# Patient Record
Sex: Female | Born: 1965 | Race: White | Hispanic: Yes | Marital: Married | Smoking: Never smoker
Health system: Southern US, Community
[De-identification: ages and names within clinical notes are randomized; demographics above are authoritative.]

## PROBLEM LIST (undated history)

## (undated) DIAGNOSIS — F32A Depression, unspecified: Secondary | ICD-10-CM

## (undated) DIAGNOSIS — F329 Major depressive disorder, single episode, unspecified: Secondary | ICD-10-CM

## (undated) DIAGNOSIS — K2970 Gastritis, unspecified, without bleeding: Secondary | ICD-10-CM

## (undated) DIAGNOSIS — F319 Bipolar disorder, unspecified: Secondary | ICD-10-CM

## (undated) DIAGNOSIS — F419 Anxiety disorder, unspecified: Secondary | ICD-10-CM

## (undated) DIAGNOSIS — M797 Fibromyalgia: Secondary | ICD-10-CM

## (undated) DIAGNOSIS — K589 Irritable bowel syndrome without diarrhea: Secondary | ICD-10-CM

## (undated) DIAGNOSIS — G43909 Migraine, unspecified, not intractable, without status migrainosus: Secondary | ICD-10-CM

## (undated) HISTORY — PX: FINGER SURGERY: SHX640

## (undated) HISTORY — PX: DILATION AND CURETTAGE OF UTERUS: SHX78

---

## 2006-07-12 ENCOUNTER — Encounter: Payer: Self-pay | Admitting: Gastroenterology

## 2006-07-18 ENCOUNTER — Encounter: Payer: Self-pay | Admitting: Gastroenterology

## 2008-03-26 ENCOUNTER — Ambulatory Visit: Payer: Self-pay | Admitting: Gastroenterology

## 2008-03-26 DIAGNOSIS — R1013 Epigastric pain: Secondary | ICD-10-CM

## 2008-03-26 DIAGNOSIS — K219 Gastro-esophageal reflux disease without esophagitis: Secondary | ICD-10-CM

## 2008-03-30 ENCOUNTER — Telehealth: Payer: Self-pay | Admitting: Gastroenterology

## 2008-03-30 ENCOUNTER — Encounter: Payer: Self-pay | Admitting: Gastroenterology

## 2008-04-23 ENCOUNTER — Ambulatory Visit: Payer: Self-pay | Admitting: Gastroenterology

## 2008-04-23 DIAGNOSIS — A088 Other specified intestinal infections: Secondary | ICD-10-CM

## 2008-04-24 ENCOUNTER — Encounter (INDEPENDENT_AMBULATORY_CARE_PROVIDER_SITE_OTHER): Payer: Self-pay

## 2008-04-24 LAB — CONVERTED CEMR LAB
AST: 29 units/L (ref 0–37)
Albumin: 3.6 g/dL (ref 3.5–5.2)
Alkaline Phosphatase: 45 units/L (ref 39–117)
BUN: 7 mg/dL (ref 6–23)
Basophils Relative: 0.2 % (ref 0.0–3.0)
Creatinine, Ser: 0.7 mg/dL (ref 0.4–1.2)
Eosinophils Absolute: 0.1 10*3/uL (ref 0.0–0.7)
Eosinophils Relative: 2.3 % (ref 0.0–5.0)
GFR calc non Af Amer: 97 mL/min
Glucose, Bld: 90 mg/dL (ref 70–99)
H Pylori IgG: NEGATIVE
HCT: 42.7 % (ref 36.0–46.0)
Hemoglobin: 15.1 g/dL — ABNORMAL HIGH (ref 12.0–15.0)
Lipase: 23 units/L (ref 11.0–59.0)
MCV: 91.3 fL (ref 78.0–100.0)
Monocytes Absolute: 0.5 10*3/uL (ref 0.1–1.0)
Monocytes Relative: 12.7 % — ABNORMAL HIGH (ref 3.0–12.0)
Neutro Abs: 1.8 10*3/uL (ref 1.4–7.7)
Platelets: 159 10*3/uL (ref 150–400)
Potassium: 3.6 meq/L (ref 3.5–5.1)
RBC: 4.68 M/uL (ref 3.87–5.11)
Total Bilirubin: 0.5 mg/dL (ref 0.3–1.2)
WBC: 3.8 10*3/uL — ABNORMAL LOW (ref 4.5–10.5)

## 2008-04-27 ENCOUNTER — Ambulatory Visit (HOSPITAL_COMMUNITY): Admission: RE | Admit: 2008-04-27 | Discharge: 2008-04-27 | Payer: Self-pay | Admitting: Gastroenterology

## 2008-04-27 DIAGNOSIS — R933 Abnormal findings on diagnostic imaging of other parts of digestive tract: Secondary | ICD-10-CM

## 2008-04-30 ENCOUNTER — Encounter: Admission: RE | Admit: 2008-04-30 | Discharge: 2008-04-30 | Payer: Self-pay | Admitting: Gastroenterology

## 2008-05-01 ENCOUNTER — Encounter: Payer: Self-pay | Admitting: Gastroenterology

## 2008-05-18 ENCOUNTER — Encounter: Payer: Self-pay | Admitting: Gastroenterology

## 2008-05-27 ENCOUNTER — Ambulatory Visit: Payer: Self-pay | Admitting: Gastroenterology

## 2008-06-12 ENCOUNTER — Ambulatory Visit: Payer: Self-pay | Admitting: Gastroenterology

## 2008-06-15 ENCOUNTER — Encounter: Payer: Self-pay | Admitting: Gastroenterology

## 2008-06-15 LAB — CONVERTED CEMR LAB: UREASE: NEGATIVE

## 2008-06-19 ENCOUNTER — Telehealth: Payer: Self-pay | Admitting: Gastroenterology

## 2008-06-22 ENCOUNTER — Telehealth: Payer: Self-pay | Admitting: Gastroenterology

## 2008-07-15 ENCOUNTER — Ambulatory Visit: Payer: Self-pay | Admitting: Gastroenterology

## 2008-08-06 ENCOUNTER — Emergency Department (HOSPITAL_COMMUNITY): Admission: EM | Admit: 2008-08-06 | Discharge: 2008-08-06 | Payer: Self-pay | Admitting: Emergency Medicine

## 2010-03-28 ENCOUNTER — Encounter: Payer: Self-pay | Admitting: Emergency Medicine

## 2010-06-13 LAB — POCT I-STAT, CHEM 8
BUN: 12 mg/dL (ref 6–23)
Chloride: 102 mEq/L (ref 96–112)
Sodium: 138 mEq/L (ref 135–145)

## 2010-06-13 LAB — DIFFERENTIAL
Basophils Absolute: 0 10*3/uL (ref 0.0–0.1)
Eosinophils Absolute: 0 10*3/uL (ref 0.0–0.7)
Eosinophils Relative: 1 % (ref 0–5)
Lymphocytes Relative: 35 % (ref 12–46)
Neutrophils Relative %: 56 % (ref 43–77)

## 2010-06-13 LAB — URINALYSIS, ROUTINE W REFLEX MICROSCOPIC
Ketones, ur: NEGATIVE mg/dL
Nitrite: NEGATIVE
Protein, ur: NEGATIVE mg/dL
pH: 7.5 (ref 5.0–8.0)

## 2010-06-13 LAB — CBC
HCT: 41.1 % (ref 36.0–46.0)
Platelets: 183 10*3/uL (ref 150–400)
RDW: 12.8 % (ref 11.5–15.5)
WBC: 5.9 10*3/uL (ref 4.0–10.5)

## 2010-06-13 LAB — POCT PREGNANCY, URINE: Preg Test, Ur: NEGATIVE

## 2010-06-13 LAB — GC/CHLAMYDIA PROBE AMP, GENITAL: Chlamydia, DNA Probe: NEGATIVE

## 2011-11-16 ENCOUNTER — Ambulatory Visit: Payer: Self-pay | Admitting: Pain Medicine

## 2014-06-18 ENCOUNTER — Other Ambulatory Visit: Payer: Self-pay | Admitting: Internal Medicine

## 2014-06-18 DIAGNOSIS — I83813 Varicose veins of bilateral lower extremities with pain: Secondary | ICD-10-CM

## 2014-07-02 ENCOUNTER — Ambulatory Visit
Admission: RE | Admit: 2014-07-02 | Discharge: 2014-07-02 | Disposition: A | Discharge status: Home / Self Care | Payer: BLUE CROSS/BLUE SHIELD | Source: Ambulatory Visit | Attending: Internal Medicine | Admitting: Internal Medicine

## 2014-07-02 DIAGNOSIS — I83813 Varicose veins of bilateral lower extremities with pain: Secondary | ICD-10-CM

## 2014-07-02 HISTORY — DX: Depression, unspecified: F32.A

## 2014-07-02 HISTORY — DX: Irritable bowel syndrome, unspecified: K58.9

## 2014-07-02 HISTORY — DX: Fibromyalgia: M79.7

## 2014-07-02 HISTORY — DX: Anxiety disorder, unspecified: F41.9

## 2014-07-02 HISTORY — DX: Bipolar disorder, unspecified: F31.9

## 2014-07-02 HISTORY — DX: Major depressive disorder, single episode, unspecified: F32.9

## 2014-07-02 HISTORY — DX: Gastritis, unspecified, without bleeding: K29.70

## 2014-07-02 HISTORY — DX: Migraine, unspecified, not intractable, without status migrainosus: G43.909

## 2014-07-02 NOTE — Consult Note (Signed)
Chief Complaint: Chief Complaint  Patient presents with  . Advice Only    [facility]icose Veins    Referring Physician(s): Nicholos Johnsamachandran, Ajith  History of Present Illness: Valerie Kent is a 49 y.o. female presenting today to the clinic for evaluation of potential chronic venous insufficiency contributing to lower leg symptoms of restless legs and discomfort.    Valerie Kent states that she has been experiencing symptoms of restless legs for several years, which is most noticeable at night/bedtime, sometimes causing her to lose sleep.  She denies any symptoms of significant swelling or pitting edema.  She denies any focal pain of her lower extremities.  She says she has occasional feeling of heaviness, but this is not temporally related to the time of day, or to being on her feet for long periods.  She does not seem to be overly concerned about the telangiectasias/spider veins that she has, but does notice that the right side is affected to a greater degree.   She has not been wearing compression stockings at this point, and has not been taking any specific medications for her leg symptoms.   She has no knowledge of a prior DVT/blood clot, and has not ever had a prior venous or arterial surgery.   Past Medical History  Diagnosis Date  . Migraines   . IBS (irritable bowel syndrome)   . Bipolar disorder   . Gastritis   . Depression   . Anxiety   . Fibromyalgia     Past Surgical History  Procedure Laterality Date  . No past surgeries      Allergies: Review of patient's allergies indicates not on file.  Medications: Prior to Admission medications   Medication Sig Start Date End Date Taking? Authorizing Provider  calcium citrate-vitamin D (CITRACAL+D) 315-200 MG-UNIT per tablet Take 1 tablet by mouth 2 (two) times daily.   Yes Historical Provider, MD  cyclobenzaprine (FLEXERIL) 10 MG tablet Take 10 mg by mouth 2 (two) times daily as needed for muscle spasms.   Yes  Historical Provider, MD  DULoxetine (CYMBALTA) 60 MG capsule Take 60 mg by mouth daily.   Yes Historical Provider, MD  lamoTRIgine (LAMICTAL) 100 MG tablet Take 100 mg by mouth daily.   Yes Historical Provider, MD  MULTIPLE VITAMIN PO Take 1 tablet by mouth daily.   Yes Historical Provider, MD  Omega-3 Fatty Acids (FISH OIL) 1000 MG CAPS Take 1 capsule by mouth daily.   Yes Historical Provider, MD  verapamil (VERELAN PM) 360 MG 24 hr capsule Take 360 mg by mouth daily after breakfast.   Yes Historical Provider, MD  zolpidem (AMBIEN) 10 MG tablet Take 10 mg by mouth at bedtime as needed for sleep.   Yes Historical Provider, MD     No family history on file.  History   Social History  . Marital Status: Married    Spouse Name: N/A  . Number of Children: N/A  . Years of Education: N/A   Social History Main Topics  . Smoking status: Never Smoker   . Smokeless tobacco: Never Used  . Alcohol Use: 0.0 oz/week    0 Standard drinks or equivalent per week     Comment: occasional glass of wine   . Drug Use: No  . Sexual Activity: Not on file   Other Topics Concern  . None   Social History Narrative  . None     Review of Systems: A 12 point ROS discussed and pertinent  positives are indicated in the HPI above.  All other systems are negative.  Review of Systems  Vital Signs: BP 124/76 mmHg  Pulse 82  Temp(Src) 98.4 F (36.9 C) (Oral)  Resp 14  Ht  (1.549 m)  Wt 118 lb (53.524 kg)  BMI 22.31 kg/m2  SpO2 99%  LMP 06/25/2014 (Approximate)  Physical Exam  Atraumatic, normocephalic.  Mucous membranes moist and pink.  No scleral icterus.  No scleral injection.  Conjugate gaze.  No adenopathy.  Neck soft supple.  CTA bilateral.  Neg W/R/R. RRR.  Neg third sounds. Abd s/nt/nd.  No rigidity. GU is deferred.  Lower extremity without pitting edema.  Telangiectasias of the bilateral lower extremity, present on the left and right calf (ant and post), worst on the right.   Telangiectasias of the right thigh. Minimal varicosity formation.  No lipodernatosclerosis or hemosiderin deposition.  No healed or open wounds.  Strong palpable DP/PT pulses.   Imaging:  DVT duplex with direct duplex of the bilateral superficial system completed today in the office.  Negative for DVT bilateral.  No evidence of GSV or SSV reflux in the left or right LE.  She has small open perforator veins of the bilateral calf regions, with no significant reflux.   Labs:  CBC: No results for input(s): WBC, HGB, HCT, PLT in the last 8760 hours.  COAGS: No results for input(s): INR, APTT in the last 8760 hours.  BMP: No results for input(s): NA, K, CL, CO2, GLUCOSE, BUN, CALCIUM, CREATININE, GFRNONAA, GFRAA in the last 8760 hours.  Invalid input(s): CMP  LIVER FUNCTION TESTS: No results for input(s): BILITOT, AST, ALT, ALKPHOS, PROT, ALBUMIN in the last 8760 hours.  TUMOR MARKERS: No results for input(s): AFPTM, CEA, CA199, CHROMGRNA in the last 8760 hours.  Assessment and Plan:  Valerie Jaquith is a 49 year old female with primary complaint of restless legs, a common symptom seen in the setting of chronic insufficiency.  She also has right greater than left lower extremity telangiectasias, with minimal varicosity formation.    A duplex US completed today demonstrates no evidence of venous reflux, so that I believe her symptoms are not related to any treatable venous disease.  She does have a small perforator vein of both the left and the right calf, which is favored not to be related to her current symptoms.    I had a long discussion with Valerie Kent about the pathophysiology of chronic venous insufficiency including the natural history and the potential treatment options, of which I believe endovascular ablation is not indicated at this time.  I do think that daily compression stocking usage could potentially help her, even if it were to slow the progression of any development of venous  insufficiency. I encouraged her to follow up with our office if she ever experiences progression of her symptoms, potentially for a repeat duplex exam.   She seems satisfied with our discussion and agrees with the plan of care.    Thank you for this interesting consult.  I greatly enjoyed meeting Valerie Kent and participating in their care.  SignedGilmer Mor 07/02/2014, 4:31 PM   I spent a total of  45 Minutes   in face to face in clinical consultation, greater than 50% of which was counseling/coordinating care for restless legs syndrome and superficial venous telangiectasias, possible chronic venous insufficiency.

## 2014-08-05 ENCOUNTER — Encounter: Payer: Self-pay | Admitting: Gastroenterology

## 2017-01-12 ENCOUNTER — Other Ambulatory Visit: Payer: Self-pay | Admitting: Orthopedic Surgery

## 2017-01-16 ENCOUNTER — Other Ambulatory Visit: Payer: Self-pay

## 2017-01-16 ENCOUNTER — Encounter (HOSPITAL_BASED_OUTPATIENT_CLINIC_OR_DEPARTMENT_OTHER): Payer: Self-pay | Admitting: *Deleted

## 2017-01-19 ENCOUNTER — Encounter (HOSPITAL_BASED_OUTPATIENT_CLINIC_OR_DEPARTMENT_OTHER)
Admission: RE | Disposition: A | Discharge status: Home / Self Care | Payer: Self-pay | Source: Ambulatory Visit | Attending: Orthopedic Surgery

## 2017-01-19 ENCOUNTER — Ambulatory Visit (HOSPITAL_BASED_OUTPATIENT_CLINIC_OR_DEPARTMENT_OTHER): Payer: BLUE CROSS/BLUE SHIELD | Admitting: Anesthesiology

## 2017-01-19 ENCOUNTER — Other Ambulatory Visit: Payer: Self-pay

## 2017-01-19 ENCOUNTER — Ambulatory Visit (HOSPITAL_BASED_OUTPATIENT_CLINIC_OR_DEPARTMENT_OTHER)
Admission: RE | Admit: 2017-01-19 | Discharge: 2017-01-19 | Disposition: A | Discharge status: Home / Self Care | Payer: BLUE CROSS/BLUE SHIELD | Source: Ambulatory Visit | Attending: Orthopedic Surgery | Admitting: Orthopedic Surgery

## 2017-01-19 ENCOUNTER — Encounter (HOSPITAL_BASED_OUTPATIENT_CLINIC_OR_DEPARTMENT_OTHER): Payer: Self-pay

## 2017-01-19 DIAGNOSIS — K219 Gastro-esophageal reflux disease without esophagitis: Secondary | ICD-10-CM | POA: Diagnosis not present

## 2017-01-19 DIAGNOSIS — M25842 Other specified joint disorders, left hand: Secondary | ICD-10-CM | POA: Diagnosis present

## 2017-01-19 DIAGNOSIS — F319 Bipolar disorder, unspecified: Secondary | ICD-10-CM | POA: Diagnosis not present

## 2017-01-19 DIAGNOSIS — M19042 Primary osteoarthritis, left hand: Secondary | ICD-10-CM | POA: Insufficient documentation

## 2017-01-19 DIAGNOSIS — M67442 Ganglion, left hand: Secondary | ICD-10-CM | POA: Diagnosis not present

## 2017-01-19 DIAGNOSIS — M25742 Osteophyte, left hand: Secondary | ICD-10-CM | POA: Insufficient documentation

## 2017-01-19 DIAGNOSIS — Z79899 Other long term (current) drug therapy: Secondary | ICD-10-CM | POA: Diagnosis not present

## 2017-01-19 DIAGNOSIS — K589 Irritable bowel syndrome without diarrhea: Secondary | ICD-10-CM | POA: Diagnosis not present

## 2017-01-19 DIAGNOSIS — F419 Anxiety disorder, unspecified: Secondary | ICD-10-CM | POA: Diagnosis not present

## 2017-01-19 DIAGNOSIS — M797 Fibromyalgia: Secondary | ICD-10-CM | POA: Diagnosis not present

## 2017-01-19 HISTORY — PX: MASS EXCISION: SHX2000

## 2017-01-19 SURGERY — EXCISION MASS
Anesthesia: Monitor Anesthesia Care | Site: Finger | Laterality: Left

## 2017-01-19 MED ORDER — MIDAZOLAM HCL 2 MG/2ML IJ SOLN
INTRAMUSCULAR | Status: AC
  Filled 2017-01-19: qty 2

## 2017-01-19 MED ORDER — SCOPOLAMINE 1 MG/3DAYS TD PT72: 1.0000 | MEDICATED_PATCH | Freq: Once | TRANSDERMAL | Status: DC | PRN

## 2017-01-19 MED ORDER — CEFAZOLIN SODIUM-DEXTROSE 2-4 GM/100ML-% IV SOLN
INTRAVENOUS | Status: AC
  Filled 2017-01-19: qty 100

## 2017-01-19 MED ORDER — LIDOCAINE 2% (20 MG/ML) 5 ML SYRINGE
INTRAMUSCULAR | Status: AC
  Filled 2017-01-19: qty 5

## 2017-01-19 MED ORDER — CHLORHEXIDINE GLUCONATE 4 % EX LIQD: 60.0000 mL | Freq: Once | CUTANEOUS | Status: DC

## 2017-01-19 MED ORDER — PROPOFOL 500 MG/50ML IV EMUL
INTRAVENOUS | Status: DC | PRN
  Administered 2017-01-19: 75 ug/kg/min via INTRAVENOUS

## 2017-01-19 MED ORDER — MIDAZOLAM HCL 2 MG/2ML IJ SOLN
1.0000 mg | INTRAMUSCULAR | Status: DC | PRN
  Administered 2017-01-19: 1 mg via INTRAVENOUS

## 2017-01-19 MED ORDER — BUPIVACAINE HCL (PF) 0.25 % IJ SOLN
INTRAMUSCULAR | Status: DC | PRN
  Administered 2017-01-19: 7 mL

## 2017-01-19 MED ORDER — ONDANSETRON HCL 4 MG/2ML IJ SOLN
INTRAMUSCULAR | Status: DC | PRN
  Administered 2017-01-19: 4 mg via INTRAVENOUS

## 2017-01-19 MED ORDER — PROMETHAZINE HCL 25 MG/ML IJ SOLN: 6.2500 mg | INTRAMUSCULAR | Status: DC | PRN

## 2017-01-19 MED ORDER — LIDOCAINE HCL (PF) 0.5 % IJ SOLN
INTRAMUSCULAR | Status: DC | PRN
  Administered 2017-01-19: 25 mL via INTRAVENOUS

## 2017-01-19 MED ORDER — LACTATED RINGERS IV SOLN
INTRAVENOUS | Status: DC
  Administered 2017-01-19: 13:00:00 via INTRAVENOUS

## 2017-01-19 MED ORDER — DEXAMETHASONE SODIUM PHOSPHATE 10 MG/ML IJ SOLN
INTRAMUSCULAR | Status: AC
  Filled 2017-01-19: qty 1

## 2017-01-19 MED ORDER — FENTANYL CITRATE (PF) 100 MCG/2ML IJ SOLN
50.0000 ug | INTRAMUSCULAR | Status: DC | PRN
  Administered 2017-01-19: 50 ug via INTRAVENOUS

## 2017-01-19 MED ORDER — HYDROMORPHONE HCL 1 MG/ML IJ SOLN: 0.2500 mg | INTRAMUSCULAR | Status: DC | PRN

## 2017-01-19 MED ORDER — FENTANYL CITRATE (PF) 100 MCG/2ML IJ SOLN
INTRAMUSCULAR | Status: AC
  Filled 2017-01-19: qty 2

## 2017-01-19 MED ORDER — ONDANSETRON HCL 4 MG/2ML IJ SOLN
INTRAMUSCULAR | Status: AC
  Filled 2017-01-19: qty 2

## 2017-01-19 MED ORDER — CEFAZOLIN SODIUM-DEXTROSE 2-4 GM/100ML-% IV SOLN
2.0000 g | INTRAVENOUS | Status: AC
Start: 2017-01-20 — End: 2017-01-19
  Administered 2017-01-19: 2 g via INTRAVENOUS

## 2017-01-19 MED ORDER — HYDROCODONE-ACETAMINOPHEN 5-325 MG PO TABS: 1.0000 | ORAL_TABLET | Freq: Four times a day (QID) | ORAL | 0 refills | Status: DC | PRN

## 2017-01-19 MED ORDER — PROPOFOL 10 MG/ML IV BOLUS
INTRAVENOUS | Status: AC
  Filled 2017-01-19: qty 20

## 2017-01-19 SURGICAL SUPPLY — 41 items
BANDAGE COBAN STERILE 2 (GAUZE/BANDAGES/DRESSINGS) IMPLANT
BLADE SURG 15 STRL LF DISP TIS (BLADE) ×1 IMPLANT
BLADE SURG 15 STRL SS (BLADE) ×2
BNDG COHESIVE 1X5 TAN STRL LF (GAUZE/BANDAGES/DRESSINGS) ×3 IMPLANT
BNDG COHESIVE 3X5 TAN STRL LF (GAUZE/BANDAGES/DRESSINGS) IMPLANT
BNDG ESMARK 4X9 LF (GAUZE/BANDAGES/DRESSINGS) IMPLANT
BNDG GAUZE ELAST 4 BULKY (GAUZE/BANDAGES/DRESSINGS) IMPLANT
CHLORAPREP W/TINT 26ML (MISCELLANEOUS) ×3 IMPLANT
CORD BIPOLAR FORCEPS 12FT (ELECTRODE) ×3 IMPLANT
COVER BACK TABLE 60X90IN (DRAPES) ×3 IMPLANT
COVER MAYO STAND STRL (DRAPES) ×3 IMPLANT
CUFF TOURNIQUET SINGLE 18IN (TOURNIQUET CUFF) ×3 IMPLANT
DECANTER SPIKE VIAL GLASS SM (MISCELLANEOUS) IMPLANT
DRAIN PENROSE 1/2X12 LTX STRL (WOUND CARE) IMPLANT
DRAPE EXTREMITY T 121X128X90 (DRAPE) ×3 IMPLANT
DRAPE SURG 17X23 STRL (DRAPES) ×3 IMPLANT
GAUZE SPONGE 4X4 12PLY STRL (GAUZE/BANDAGES/DRESSINGS) ×3 IMPLANT
GAUZE XEROFORM 1X8 LF (GAUZE/BANDAGES/DRESSINGS) ×3 IMPLANT
GLOVE BIOGEL PI IND STRL 8.5 (GLOVE) ×1 IMPLANT
GLOVE BIOGEL PI INDICATOR 8.5 (GLOVE) ×2
GLOVE SURG ORTHO 8.0 STRL STRW (GLOVE) ×3 IMPLANT
GOWN STRL REUS W/ TWL LRG LVL3 (GOWN DISPOSABLE) ×1 IMPLANT
GOWN STRL REUS W/TWL LRG LVL3 (GOWN DISPOSABLE) ×2
GOWN STRL REUS W/TWL XL LVL3 (GOWN DISPOSABLE) ×3 IMPLANT
NDL SAFETY ECLIPSE 18X1.5 (NEEDLE) IMPLANT
NEEDLE HYPO 18GX1.5 SHARP (NEEDLE)
NEEDLE PRECISIONGLIDE 27X1.5 (NEEDLE) ×3 IMPLANT
NS IRRIG 1000ML POUR BTL (IV SOLUTION) ×3 IMPLANT
PACK BASIN DAY SURGERY FS (CUSTOM PROCEDURE TRAY) ×3 IMPLANT
PAD CAST 3X4 CTTN HI CHSV (CAST SUPPLIES) IMPLANT
PADDING CAST COTTON 3X4 STRL (CAST SUPPLIES)
SPLINT FINGER 3.25 BULB 911905 (SOFTGOODS) ×3 IMPLANT
SPLINT PLASTER CAST XFAST 3X15 (CAST SUPPLIES) IMPLANT
SPLINT PLASTER XTRA FASTSET 3X (CAST SUPPLIES)
STOCKINETTE 4X48 STRL (DRAPES) ×3 IMPLANT
SUT ETHILON 4 0 PS 2 18 (SUTURE) ×3 IMPLANT
SUT VIC AB 4-0 P2 18 (SUTURE) IMPLANT
SYR BULB 3OZ (MISCELLANEOUS) ×3 IMPLANT
SYR CONTROL 10ML LL (SYRINGE) ×3 IMPLANT
TOWEL OR 17X24 6PK STRL BLUE (TOWEL DISPOSABLE) ×3 IMPLANT
UNDERPAD 30X30 (UNDERPADS AND DIAPERS) ×3 IMPLANT

## 2017-01-19 NOTE — H&P (Signed)
Valerie Kent is an 51 y.o. female.   Chief Complaint:mass left ring finger HPI: Valerie Kent is a 51 year old right-hand-dominant female referred by Dr. Spero CurbSheffield for consultation regarding a mass on her left ring finger. She states is been present for approximately a month last week it opened up and drained a clear jelly. She recalls no history of injury. Is causing minimal discomfort for her. she has been placed on a topical antibiotic for this. She is complaining of numbness and tingling in her fingers thumb to ring bilaterally. She has a history of injury to her neck. This awakens her 7 out of 7 nights. Nothing seems to make it better or worse. Her right is slightly greater than the left in symptoms. She has no history of injury to the hands. She has a history of arthritis no history of diabetes thyroid problems or gout. Family history is positive for thyroid problems arthritis and gout negative for diabetes. She has had her nerve conductions done by Dr. Riccardo DubinKarvelas revealing carpal tunnel syndrome on her right side is negative on her left side this includes a ultrasound and study on her right side showing enlargement of the nerve. Her right motor delay is 4.6. Side is entirely normal. It has been injected.          Past Medical History:  Diagnosis Date  . Anxiety   . Bipolar disorder (HCC)   . Depression   . Fibromyalgia   . Gastritis   . IBS (irritable bowel syndrome)   . Migraines     Past Surgical History:  Procedure Laterality Date  . DILATION AND CURETTAGE OF UTERUS      History reviewed. No pertinent family history. Social History:  reports that  has never smoked. she has never used smokeless tobacco. She reports that she drinks alcohol. She reports that she does not use drugs.  Allergies: No Known Allergies  No medications prior to admission.    No results found for this or any previous visit (from the past 48 hour(s)).  No results found.   Pertinent items are noted  in HPI.  Height 5\' 1"  (1.549 m), weight 56.2 kg (124 lb), last menstrual period 04/26/2016.  General appearance: alert, cooperative and appears stated age Head: Normocephalic, without obvious abnormality Neck: no JVD Resp: clear to auscultation bilaterally Cardio: regular rate and rhythm, S1, S2 normal, no murmur, click, rub or gallop GI: soft, non-tender; bowel sounds normal; no masses,  no organomegaly Extremities: mass left ring finger Pulses: 2+ and symmetric Skin: Skin color, texture, turgor normal. No rashes or lesions Neurologic: Grossly normal Incision/Wound: na  Assessment/Plan Assessment:  1. Carpal tunnel syndrome of right wrist  2. Osteoarthritis of finger of left hand  3. Mucoid cyst, joint    Plan: Discussed each of the problems with her. Discussed possibility of excision of the cyst debridement of the distal interphalangeal joint which he would like to proceed to have done. Pre-peri-and postoperative course been discussed along with risk complications. She is where there is no guarantee to the surgery the possibility of infection recurrence injury to arteries nerves tendons complete relief symptoms dystrophy. We will have Bluford Mainobert Dasnoit,PAC injected her carpal canal on her right side. She is scheduled for excision of mucoid cyst debridement distal phalangeal joint left ring finger as an outpatient under regional anesthesia.      Zuriel Yeaman R 01/19/2017, 6:35 AM

## 2017-01-19 NOTE — Brief Op Note (Signed)
01/19/2017  1:52 PM  PATIENT:  Fonnie JarvisZoraida Renda  51 y.o. 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  POST-OPERATIVE DIAGNOSIS:  MUCOID CYST LEFT RING DEGENERATIVE JOINT   PROCEDURE:  Procedure(s) with comments: EXCISION MUCOID CYST DEBRIDEMENT DISTAL INTERPHaLANGEAL JOINT (Left) - FAB  SURGEON:  Surgeon(s) and Role:    Cindee Salt* Moe Graca, MD - Primary  PHYSICIAN ASSISTANT:   ASSISTANTS: none   ANESTHESIA:   local, epidural and IV sedation  EBL:  none  BLOOD ADMINISTERED:none  DRAINS: none   LOCAL MEDICATIONS USED:  BUPIVICAINE   SPECIMEN:  Excision  DISPOSITION OF SPECIMEN:  PATHOLOGY  COUNTS:  YES  TOURNIQUET:   Total Tourniquet Time Documented: Upper Arm (laterality) - 14 minutes Total: Upper Arm (laterality) - 14 minutes   DICTATION: .Other Dictation: Dictation Number [id number]  PLAN OF CARE: Discharge to home after PACU  PATIENT DISPOSITION:  PACU - hemodynamically stable.

## 2017-01-19 NOTE — Anesthesia Procedure Notes (Signed)
Anesthesia Regional Block: Bier block (IV Regional)   Pre-Anesthetic Checklist: ,, timeout performed, Correct Patient, Correct Site, Correct Laterality, Correct Procedure, Correct Position, site marked, Risks and benefits discussed, Surgical consent,  Pre-op evaluation,  At surgeon's request  Laterality: Left  Prep: alcohol swabs        Procedures:,,,,,,,, #20gu IV placed  Narrative:   Additional Notes: 0.5% lidocaine injected after arm exsanguinated with esmark, radial pulse negative, 25 ml injected slowly, iv removed, pt tolerated procedure well

## 2017-01-19 NOTE — Transfer of Care (Signed)
Immediate Anesthesia Transfer of Care Note  Patient: Valerie Kent  Procedure(s) Performed: EXCISION MUCOID CYST DEBRIDEMENT DISTAL INTERPHaLANGEAL JOINT (Left Finger)  Patient Location: PACU  Anesthesia Type:MAC and Bier block  Level of Consciousness: awake, alert  and oriented  Airway & Oxygen Therapy: Patient Spontanous Breathing and Patient connected to face mask oxygen  Post-op Assessment: Report given to RN and Post -op Vital signs reviewed and stable  Post vital signs: Reviewed and stable  Last Vitals:  Vitals:   01/19/17 1225  BP: 122/70  Pulse: 76  Resp: 16  Temp: 36.8 C  SpO2: 100%    Last Pain:  Vitals:   01/19/17 1225  TempSrc: Oral         Complications: No apparent anesthesia complications

## 2017-01-19 NOTE — Discharge Instructions (Signed)

## 2017-01-19 NOTE — Anesthesia Postprocedure Evaluation (Signed)
Anesthesia Post Note  Patient: Valerie Kent  Procedure(s) Performed: EXCISION MUCOID CYST DEBRIDEMENT DISTAL INTERPHaLANGEAL JOINT (Left Finger)     Patient location during evaluation: PACU Anesthesia Type: MAC Level of consciousness: awake and alert and oriented Pain management: pain level controlled Vital Signs Assessment: post-procedure vital signs reviewed and stable Respiratory status: spontaneous breathing, nonlabored ventilation and respiratory function stable Cardiovascular status: stable and blood pressure returned to baseline Postop Assessment: no apparent nausea or vomiting Anesthetic complications: no    Last Vitals:  Vitals:   01/19/17 1415 01/19/17 1438  BP: 121/73 123/78  Pulse: 69 76  Resp: 11 18  Temp:  36.6 C  SpO2: 99% 98%    Last Pain:  Vitals:   01/19/17 1225  TempSrc: Oral                 Railey Glad A.

## 2017-01-19 NOTE — Anesthesia Preprocedure Evaluation (Addendum)
Anesthesia Evaluation  Patient identified by MRN, date of birth, ID band Patient awake    Reviewed: Allergy & Precautions, NPO status , Patient's Chart, lab work & pertinent test results  History of Anesthesia Complications Negative for: history of anesthetic complications  Airway Mallampati: II  TM Distance: >3 FB Neck ROM: Full    Dental  (+) Poor Dentition, Dental Advisory Given   Pulmonary neg pulmonary ROS,    Pulmonary exam normal        Cardiovascular negative cardio ROS Normal cardiovascular exam     Neuro/Psych  Headaches, PSYCHIATRIC DISORDERS Anxiety Depression Bipolar Disorder    GI/Hepatic Neg liver ROS, GERD  ,  Endo/Other  negative endocrine ROS  Renal/GU negative Renal ROS     Musculoskeletal negative musculoskeletal ROS (+)   Abdominal   Peds  Hematology negative hematology ROS (+)   Anesthesia Other Findings Day of surgery medications reviewed with the patient.  Reproductive/Obstetrics                            Anesthesia Physical Anesthesia Plan  ASA: II  Anesthesia Plan: Bier Block and MAC and Bier Block-LIDOCAINE ONLY   Post-op Pain Management:    Induction:   PONV Risk Score and Plan: 2 and Ondansetron and Dexamethasone  Airway Management Planned: Natural Airway  Additional Equipment:   Intra-op Plan:   Post-operative Plan:   Informed Consent: I have reviewed the patients History and Physical, chart, labs and discussed the procedure including the risks, benefits and alternatives for the proposed anesthesia with the patient or authorized representative who has indicated his/her understanding and acceptance.   Dental advisory given  Plan Discussed with: CRNA, Anesthesiologist and Surgeon  Anesthesia Plan Comments:        Anesthesia Quick Evaluation

## 2017-01-19 NOTE — Op Note (Signed)
Dictation Number [id number]

## 2017-01-20 NOTE — Op Note (Signed)
NAMManuela Kent:  [place],                      ACCOUNT NO.:  [id number]  MEDICAL RECORD NO.:  [id number]  LOCATION:                                 FACILITY:  PHYSICIAN:  Cindee SaltGary Gerri Acre, M.D.            DATE OF BIRTH:  DATE OF PROCEDURE:  01/19/2017 DATE OF DISCHARGE:                              OPERATIVE REPORT   PREOPERATIVE DIAGNOSIS:  Mucoid cyst with degenerative arthritis, left ring finger distal interphalangeal joint.  POSTOPERATIVE DIAGNOSIS:  Mucoid cyst with degenerative arthritis, left ring finger distal interphalangeal joint.  OPERATION:  Excision of mucoid cyst with debridement distal interphalangeal joint, left ring finger.  SURGEON:  Cindee SaltGary Maris Bena, M.D.  ANESTHESIA:  Forearm-based IV regional with local infiltration and sedation.  [facility]  ANESTHESIOLOGIST:  Quita SkyeJames D. Krista BlueSinger, M.D.  HISTORY:  The patient is a 51 year old female with a history of a mass over the dorsal aspect of her left ring finger with grooving of the nail plate distally.  X-rays revealed degenerative arthritis of the distal interphalangeal joint.  She is desirous having this excised with the joint debrided.  She is aware that there is no guarantee to the surgery; the possibility of infection; recurrence of injury to arteries, nerves, tendons; incomplete relief of symptoms; dystrophy.  In the preoperative area, the patient was seen, the extremity marked by both patient and surgeon.  Antibiotic given.  DESCRIPTION OF PROCEDURE:  The patient was brought to the operating room, where a forearm-based IV regional anesthetic was carried out without difficulty.  She was prepped using ChloraPrep in a supine position with the left arm free.  A 3-minute dry time was allowed, and a time-out taken, confirming the patient and procedure.  A metacarpal block was given with 0.25% bupivacaine without epinephrine, approximately 7 mL was used.  A curvilinear incision was made over the distal  interphalangeal joint, carried down along the lateral line, carried down through subcutaneous tissue.  Bleeders were electrocauterized as necessary.  The cyst was then approached through a subcutaneous tunnel.  A House curette and hemostatic rongeur were then used to debride the area leaving the skin intact.  The joint was then opened on its radial aspect.  The specimen was sent to Pathology.  A synovectomy with excision of osteophytes from the dorsal aspect of the middle phalanx were removed with hemostatic rongeur.  The joint was copiously irrigated with saline.  The skin was then closed with interrupted 4-0 nylon sutures.  A sterile compressive dressing and splint to the finger were applied.  On deflation of the tourniquet, remaining fingers pinked.  She was taken to the recovery room for observation in satisfactory condition.  She will be discharged home [facility] on [place].          ______________________________ Cindee SaltGary Lochlin Eppinger, M.D.     GK/MEDQ  D:  01/19/2017  T:  01/20/2017  Job:  [id number]

## 2017-01-22 ENCOUNTER — Encounter (HOSPITAL_BASED_OUTPATIENT_CLINIC_OR_DEPARTMENT_OTHER): Payer: Self-pay | Admitting: Orthopedic Surgery

## 2017-04-30 ENCOUNTER — Other Ambulatory Visit: Payer: Self-pay | Admitting: Orthopedic Surgery

## 2017-05-14 ENCOUNTER — Encounter (HOSPITAL_BASED_OUTPATIENT_CLINIC_OR_DEPARTMENT_OTHER): Payer: Self-pay | Admitting: *Deleted

## 2017-05-14 ENCOUNTER — Other Ambulatory Visit: Payer: Self-pay

## 2017-05-18 ENCOUNTER — Ambulatory Visit (HOSPITAL_BASED_OUTPATIENT_CLINIC_OR_DEPARTMENT_OTHER): Payer: BLUE CROSS/BLUE SHIELD | Admitting: Certified Registered Nurse Anesthetist

## 2017-05-18 ENCOUNTER — Other Ambulatory Visit: Payer: Self-pay

## 2017-05-18 ENCOUNTER — Encounter (HOSPITAL_BASED_OUTPATIENT_CLINIC_OR_DEPARTMENT_OTHER): Payer: Self-pay | Admitting: *Deleted

## 2017-05-18 ENCOUNTER — Ambulatory Visit (HOSPITAL_BASED_OUTPATIENT_CLINIC_OR_DEPARTMENT_OTHER)
Admission: RE | Admit: 2017-05-18 | Discharge: 2017-05-18 | Disposition: A | Discharge status: Home / Self Care | Payer: BLUE CROSS/BLUE SHIELD | Source: Ambulatory Visit | Attending: Orthopedic Surgery | Admitting: Orthopedic Surgery

## 2017-05-18 ENCOUNTER — Encounter (HOSPITAL_BASED_OUTPATIENT_CLINIC_OR_DEPARTMENT_OTHER)
Admission: RE | Disposition: A | Discharge status: Home / Self Care | Payer: Self-pay | Source: Ambulatory Visit | Attending: Orthopedic Surgery

## 2017-05-18 DIAGNOSIS — G5601 Carpal tunnel syndrome, right upper limb: Secondary | ICD-10-CM | POA: Diagnosis not present

## 2017-05-18 DIAGNOSIS — F319 Bipolar disorder, unspecified: Secondary | ICD-10-CM | POA: Diagnosis not present

## 2017-05-18 DIAGNOSIS — Z79899 Other long term (current) drug therapy: Secondary | ICD-10-CM | POA: Insufficient documentation

## 2017-05-18 DIAGNOSIS — F419 Anxiety disorder, unspecified: Secondary | ICD-10-CM | POA: Insufficient documentation

## 2017-05-18 HISTORY — PX: CARPAL TUNNEL RELEASE: SHX101

## 2017-05-18 SURGERY — CARPAL TUNNEL RELEASE
Anesthesia: Monitor Anesthesia Care | Site: Wrist | Laterality: Right

## 2017-05-18 MED ORDER — CEFAZOLIN SODIUM-DEXTROSE 2-4 GM/100ML-% IV SOLN
INTRAVENOUS | Status: AC
  Filled 2017-05-18: qty 100

## 2017-05-18 MED ORDER — ONDANSETRON HCL 4 MG/2ML IJ SOLN
INTRAMUSCULAR | Status: DC | PRN
  Administered 2017-05-18: 4 mg via INTRAVENOUS

## 2017-05-18 MED ORDER — CEFAZOLIN SODIUM-DEXTROSE 2-4 GM/100ML-% IV SOLN
2.0000 g | INTRAVENOUS | Status: AC
  Administered 2017-05-18: 2 g via INTRAVENOUS

## 2017-05-18 MED ORDER — FENTANYL CITRATE (PF) 100 MCG/2ML IJ SOLN: 25.0000 ug | INTRAMUSCULAR | Status: DC | PRN

## 2017-05-18 MED ORDER — LIDOCAINE HCL (PF) 0.5 % IJ SOLN
INTRAMUSCULAR | Status: DC | PRN
  Administered 2017-05-18: 25 mL via INTRAVENOUS

## 2017-05-18 MED ORDER — PROMETHAZINE HCL 25 MG/ML IJ SOLN: 6.2500 mg | INTRAMUSCULAR | Status: DC | PRN

## 2017-05-18 MED ORDER — DEXAMETHASONE SODIUM PHOSPHATE 10 MG/ML IJ SOLN
INTRAMUSCULAR | Status: AC
  Filled 2017-05-18: qty 1

## 2017-05-18 MED ORDER — HYDROCODONE-ACETAMINOPHEN 5-325 MG PO TABS: 1.0000 | ORAL_TABLET | Freq: Four times a day (QID) | ORAL | 0 refills | Status: DC | PRN

## 2017-05-18 MED ORDER — PROPOFOL 10 MG/ML IV BOLUS
INTRAVENOUS | Status: DC | PRN
  Administered 2017-05-18 (×2): 20 mg via INTRAVENOUS

## 2017-05-18 MED ORDER — FENTANYL CITRATE (PF) 100 MCG/2ML IJ SOLN
INTRAMUSCULAR | Status: DC | PRN
  Administered 2017-05-18: 25 ug via INTRAVENOUS

## 2017-05-18 MED ORDER — FENTANYL CITRATE (PF) 100 MCG/2ML IJ SOLN
INTRAMUSCULAR | Status: AC
  Filled 2017-05-18: qty 2

## 2017-05-18 MED ORDER — FENTANYL CITRATE (PF) 100 MCG/2ML IJ SOLN: 50.0000 ug | INTRAMUSCULAR | Status: DC | PRN

## 2017-05-18 MED ORDER — DEXAMETHASONE SODIUM PHOSPHATE 10 MG/ML IJ SOLN
INTRAMUSCULAR | Status: DC | PRN
  Administered 2017-05-18: 10 mg via INTRAVENOUS

## 2017-05-18 MED ORDER — ONDANSETRON HCL 4 MG/2ML IJ SOLN
INTRAMUSCULAR | Status: AC
  Filled 2017-05-18: qty 2

## 2017-05-18 MED ORDER — BUPIVACAINE HCL (PF) 0.25 % IJ SOLN
INTRAMUSCULAR | Status: DC | PRN
  Administered 2017-05-18: 5 mL

## 2017-05-18 MED ORDER — 0.9 % SODIUM CHLORIDE (POUR BTL) OPTIME
TOPICAL | Status: DC | PRN
  Administered 2017-05-18: 100 mL

## 2017-05-18 MED ORDER — BUPIVACAINE HCL (PF) 0.25 % IJ SOLN
INTRAMUSCULAR | Status: AC
Start: 2017-05-18 — End: ?
  Filled 2017-05-18: qty 30

## 2017-05-18 MED ORDER — LACTATED RINGERS IV SOLN
INTRAVENOUS | Status: DC
  Administered 2017-05-18: 13:00:00 via INTRAVENOUS

## 2017-05-18 MED ORDER — MIDAZOLAM HCL 2 MG/2ML IJ SOLN
INTRAMUSCULAR | Status: AC
  Filled 2017-05-18: qty 2

## 2017-05-18 MED ORDER — PROPOFOL 500 MG/50ML IV EMUL
INTRAVENOUS | Status: DC | PRN
  Administered 2017-05-18: 50 ug/kg/min via INTRAVENOUS

## 2017-05-18 MED ORDER — MIDAZOLAM HCL 2 MG/2ML IJ SOLN: 1.0000 mg | INTRAMUSCULAR | Status: DC | PRN

## 2017-05-18 MED ORDER — SCOPOLAMINE 1 MG/3DAYS TD PT72: 1.0000 | MEDICATED_PATCH | Freq: Once | TRANSDERMAL | Status: DC | PRN

## 2017-05-18 MED ORDER — MIDAZOLAM HCL 5 MG/5ML IJ SOLN
INTRAMUSCULAR | Status: DC | PRN
  Administered 2017-05-18: 2 mg via INTRAVENOUS

## 2017-05-18 MED ORDER — CHLORHEXIDINE GLUCONATE 4 % EX LIQD: 60.0000 mL | Freq: Once | CUTANEOUS | Status: DC

## 2017-05-18 SURGICAL SUPPLY — 35 items
BLADE SURG 15 STRL LF DISP TIS (BLADE) ×1 IMPLANT
BLADE SURG 15 STRL SS (BLADE) ×2
BNDG COHESIVE 3X5 TAN STRL LF (GAUZE/BANDAGES/DRESSINGS) ×3 IMPLANT
BNDG ESMARK 4X9 LF (GAUZE/BANDAGES/DRESSINGS) IMPLANT
BNDG GAUZE ELAST 4 BULKY (GAUZE/BANDAGES/DRESSINGS) ×3 IMPLANT
CHLORAPREP W/TINT 26ML (MISCELLANEOUS) ×3 IMPLANT
CORD BIPOLAR FORCEPS 12FT (ELECTRODE) ×3 IMPLANT
COVER BACK TABLE 60X90IN (DRAPES) ×3 IMPLANT
COVER MAYO STAND STRL (DRAPES) ×3 IMPLANT
CUFF TOURNIQUET SINGLE 18IN (TOURNIQUET CUFF) ×3 IMPLANT
DRAPE EXTREMITY T 121X128X90 (DRAPE) ×3 IMPLANT
DRAPE SURG 17X23 STRL (DRAPES) ×3 IMPLANT
DRSG PAD ABDOMINAL 8X10 ST (GAUZE/BANDAGES/DRESSINGS) ×3 IMPLANT
GAUZE SPONGE 4X4 12PLY STRL (GAUZE/BANDAGES/DRESSINGS) ×3 IMPLANT
GAUZE XEROFORM 1X8 LF (GAUZE/BANDAGES/DRESSINGS) ×3 IMPLANT
GLOVE BIOGEL PI IND STRL 7.0 (GLOVE) ×2 IMPLANT
GLOVE BIOGEL PI IND STRL 8.5 (GLOVE) ×1 IMPLANT
GLOVE BIOGEL PI INDICATOR 7.0 (GLOVE) ×4
GLOVE BIOGEL PI INDICATOR 8.5 (GLOVE) ×2
GLOVE ECLIPSE 7.0 STRL STRAW (GLOVE) ×6 IMPLANT
GLOVE SURG ORTHO 8.0 STRL STRW (GLOVE) ×3 IMPLANT
GLOVE SURG SS PI 6.0 STRL IVOR (GLOVE) ×3 IMPLANT
GOWN STRL REUS W/ TWL LRG LVL3 (GOWN DISPOSABLE) ×2 IMPLANT
GOWN STRL REUS W/TWL LRG LVL3 (GOWN DISPOSABLE) ×4
GOWN STRL REUS W/TWL XL LVL3 (GOWN DISPOSABLE) ×3 IMPLANT
NEEDLE PRECISIONGLIDE 27X1.5 (NEEDLE) ×3 IMPLANT
NS IRRIG 1000ML POUR BTL (IV SOLUTION) ×3 IMPLANT
PACK BASIN DAY SURGERY FS (CUSTOM PROCEDURE TRAY) ×3 IMPLANT
STOCKINETTE 4X48 STRL (DRAPES) ×3 IMPLANT
SUT ETHILON 4 0 PS 2 18 (SUTURE) ×3 IMPLANT
SUT VICRYL 4-0 PS2 18IN ABS (SUTURE) IMPLANT
SYR BULB 3OZ (MISCELLANEOUS) ×3 IMPLANT
SYR CONTROL 10ML LL (SYRINGE) ×3 IMPLANT
TOWEL OR 17X24 6PK STRL BLUE (TOWEL DISPOSABLE) ×3 IMPLANT
UNDERPAD 30X30 (UNDERPADS AND DIAPERS) ×3 IMPLANT

## 2017-05-18 NOTE — Anesthesia Preprocedure Evaluation (Addendum)
Anesthesia Evaluation  Patient identified by MRN, date of birth, ID band Patient awake    Reviewed: Allergy & Precautions, NPO status , Patient's Chart, lab work & pertinent test results  Airway Mallampati: II  TM Distance: >3 FB Neck ROM: Full    Dental  (+) Teeth Intact, Dental Advisory Given   Pulmonary neg pulmonary ROS,    Pulmonary exam normal breath sounds clear to auscultation       Cardiovascular + Peripheral Vascular Disease  Normal cardiovascular exam+ Valvular Problems/Murmurs MVP  Rhythm:Regular Rate:Normal     Neuro/Psych  Headaches, PSYCHIATRIC DISORDERS Anxiety Depression Bipolar Disorder  Neuromuscular disease    GI/Hepatic Neg liver ROS, GERD  ,  Endo/Other  negative endocrine ROS  Renal/GU negative Renal ROS     Musculoskeletal  (+) Fibromyalgia -  Abdominal   Peds  Hematology negative hematology ROS (+)   Anesthesia Other Findings Day of surgery medications reviewed with the patient.  Reproductive/Obstetrics                            Anesthesia Physical Anesthesia Plan  ASA: II  Anesthesia Plan: MAC and Bier Block and Bier Block-LIDOCAINE ONLY   Post-op Pain Management:    Induction: Intravenous  PONV Risk Score and Plan: 2 and Propofol infusion, Midazolam and Treatment may vary due to age or medical condition  Airway Management Planned: Simple Face Mask  Additional Equipment:   Intra-op Plan:   Post-operative Plan:   Informed Consent: I have reviewed the patients History and Physical, chart, labs and discussed the procedure including the risks, benefits and alternatives for the proposed anesthesia with the patient or authorized representative who has indicated his/her understanding and acceptance.   Dental advisory given  Plan Discussed with:   Anesthesia Plan Comments: (BIER BLOCK plus MAC)     Anesthesia Quick Evaluation

## 2017-05-18 NOTE — Brief Op Note (Signed)
05/18/2017  2:00 PM  PATIENT:  Valerie JarvisZoraida Kent  [age] y.o. female  PRE-OPERATIVE DIAGNOSIS:  Right Carpal Tunnel Syndrome  POST-OPERATIVE DIAGNOSIS:  Right Carpal Tunnel Syndrome  PROCEDURE:  Procedure(s): RIGHT CARPAL TUNNEL RELEASE (Right)  SURGEON:  Surgeon(s) and Role:    Cindee Salt* Janya Eveland, MD - Primary  PHYSICIAN ASSISTANT:   ASSISTANTS: none   ANESTHESIA:   local, regional and IV sedation  EBL:  1 mL   BLOOD ADMINISTERED:none  DRAINS: none   LOCAL MEDICATIONS USED:  BUPIVICAINE   SPECIMEN:  No Specimen  DISPOSITION OF SPECIMEN:  N/A  COUNTS:  YES  TOURNIQUET:   Total Tourniquet Time Documented: Forearm (Right) - 17 minutes Total: Forearm (Right) - 17 minutes   DICTATION: .Other Dictation: Dictation Number [id number]  PLAN OF CARE: Discharge to home after PACU  PATIENT DISPOSITION:  PACU - hemodynamically stable.

## 2017-05-18 NOTE — H&P (Signed)
Valerie Kent is an 52 y.o. female.   Chief Complaint: numbness right handHPI:Valerie Kent is a 52 year old right-hand-dominant female referred by Dr. Spero Curb for consultation regarding a mass on her left ring finger. She states is been present for approximately a month last week it opened up and drained a clear jelly. She recalls no history of injury. Is causing minimal discomfort for her. she has been placed on a topical antibiotic for this. She is complaining of numbness and tingling in her fingers thumb to ring bilaterally. She has a history of injury to her neck. This awakens her 7 out of 7 nights. Nothing seems to make it better or worse. Her right is slightly greater than the left in symptoms. She has no history of injury to the hands. She has a history of arthritis no history of diabetes thyroid problems or gout. Family history is positive for thyroid problems arthritis and gout negative for diabetes. She has positive nerve conductions done by Dr. Riccardo Dubin. Shows enlargement of the nerve on the right side motor component along with conduction delays. She has a history of arthritis no history of diabetes thyroid problems or gout. Family history is positive for thyroid problems arthritis and gout. It is negative for diabetes.          Past Medical History:  Diagnosis Date  . Anxiety   . Bipolar disorder (HCC)   . Depression   . Fibromyalgia   . Gastritis   . IBS (irritable bowel syndrome)   . Migraines    verapamil    Past Surgical History:  Procedure Laterality Date  . DILATION AND CURETTAGE OF UTERUS    . FINGER SURGERY Left    ring finger cyst removal  . MASS EXCISION Left 01/19/2017   Procedure: EXCISION MUCOID CYST DEBRIDEMENT DISTAL INTERPHaLANGEAL JOINT;  Surgeon: Cindee Salt, MD;  Location: [facility];  Service: Orthopedics;  Laterality: Left;  FAB    History reviewed. No pertinent family history. Social History:  reports that  has never smoked. she has  never used smokeless tobacco. She reports that she drinks alcohol. She reports that she does not use drugs.  Allergies: No Known Allergies  Medications Prior to Admission  Medication Sig Dispense Refill  . calcium citrate-vitamin D (CITRACAL+D) 315-200 MG-UNIT per tablet Take 1 tablet by mouth 2 (two) times daily.    . DULoxetine (CYMBALTA) 60 MG capsule Take 90 mg daily by mouth.     . lamoTRIgine (LAMICTAL) 100 MG tablet Take 150 mg daily by mouth.     . MULTIPLE VITAMIN PO Take 1 tablet by mouth daily.    . Omega-3 Fatty Acids (FISH OIL) 1000 MG CAPS Take 1 capsule by mouth daily.    . verapamil (VERELAN PM) 360 MG 24 hr capsule Take 480 mg daily after breakfast by mouth.     . zolpidem (AMBIEN) 10 MG tablet Take 10 mg by mouth at bedtime as needed for sleep.      No results found for this or any previous visit (from the past 48 hour(s)).  No results found.   Pertinent items are noted in HPI.  Blood pressure 111/64, pulse 81, temperature 98.4 F (36.9 C), resp. rate 20, height 5\' 1"  (1.549 m), weight 53.7 kg (118 lb 6.4 oz), last menstrual period 02/24/2016, SpO2 100 %.  General appearance: alert, cooperative and appears stated age Head: Normocephalic, without obvious abnormality Neck: no JVD Resp: clear to auscultation bilaterally Cardio: regular rate and rhythm, S1, S2  normal, no murmur, click, rub or gallop GI: soft, non-tender; bowel sounds normal; no masses,  no organomegaly Extremities: numbness right hand Pulses: 2+ and symmetric Skin: Skin color, texture, turgor normal. No rashes or lesions Neurologic: Grossly normal Incision/Wound: na  Assessment/Plan Assessment:   Carpal tunnel syndrome of right wrist    Plan: She would like to proceed to have this surgically released. Pre-peri-and postoperative course are discussed along with risks and complications. She is aware that there is no guarantee to the surgery the possibility of infection recurrence injury to  arteries nerves tendons incomplete release symptoms dystrophy. She is scheduled for right carpal tunnel release in outpatient under regional anesthesia. Questions are encouraged and answered to her satisfaction.      Meklit Cotta R 05/18/2017, 1:11 PM

## 2017-05-18 NOTE — Discharge Instructions (Signed)

## 2017-05-18 NOTE — Op Note (Signed)
Other Dictation: Dictation Number [id number]

## 2017-05-18 NOTE — Anesthesia Postprocedure Evaluation (Signed)
Anesthesia Post Note  Patient: Valerie Kent  Procedure(s) Performed: RIGHT CARPAL TUNNEL RELEASE (Right Wrist)     Patient location during evaluation: PACU Anesthesia Type: MAC and Bier Block Level of consciousness: awake and alert, oriented, patient cooperative and awake Pain management: pain level controlled Vital Signs Assessment: post-procedure vital signs reviewed and stable Respiratory status: spontaneous breathing, nonlabored ventilation and respiratory function stable Cardiovascular status: stable and blood pressure returned to baseline Postop Assessment: no apparent nausea or vomiting Anesthetic complications: no    Last Vitals:  Vitals:   05/18/17 1229 05/18/17 1402  BP: 111/64 107/70  Pulse: 81 75  Resp: 20 18  Temp: 36.9 C 36.5 C  SpO2: 100% 95%    Last Pain:  Vitals:   05/18/17 1500  PainSc: 0-No pain                 Catalina Gravel

## 2017-05-18 NOTE — Anesthesia Procedure Notes (Signed)
Anesthesia Regional Block: Bier block (IV Regional)   Pre-Anesthetic Checklist: ,, timeout performed, Correct Patient, Correct Site, Correct Laterality, Correct Procedure, Correct Position, site marked, Risks and benefits discussed,  Surgical consent,  Pre-op evaluation,  At surgeon's request and post-op pain management  Laterality: Right  Prep: alcohol swabs       Needles:  Injection technique: Single-shot      Additional Needles:   Procedures:,,,,, intact distal pulses, Esmarch exsanguination, single tourniquet utilized, #20gu IV placed  Narrative:

## 2017-05-18 NOTE — Transfer of Care (Signed)
Immediate Anesthesia Transfer of Care Note  Patient: Valerie Kent  Procedure(s) Performed: RIGHT CARPAL TUNNEL RELEASE (Right Wrist)  Patient Location: PACU  Anesthesia Type:MAC and Bier block  Level of Consciousness: awake, alert  and oriented  Airway & Oxygen Therapy: Patient Spontanous Breathing and Patient connected to face mask oxygen  Post-op Assessment: Report given to RN and Post -op Vital signs reviewed and stable  Post vital signs: Reviewed and stable  Last Vitals:  Vitals:   05/18/17 1229 05/18/17 1402  BP: 111/64 107/70  Pulse: 81 75  Resp: 20 18  Temp: 36.9 C   SpO2: 100% 95%    Last Pain: There were no vitals filed for this visit.       Complications: No apparent anesthesia complications

## 2017-05-19 NOTE — Op Note (Signed)
NAMDoreen Beam:  Nehme, JORIATA              ACCOUNT NO.:  [id number]  MEDICAL RECORD NO.:  [id number]  LOCATION:                                 FACILITY:  PHYSICIAN:  Cindee SaltGary Salvatrice Morandi, M.D.            DATE OF BIRTH:  DATE OF PROCEDURE:  05/18/2017 DATE OF DISCHARGE:                              OPERATIVE REPORT   PREOPERATIVE DIAGNOSIS:  Carpal tunnel syndrome, right hand.  POSTOPERATIVE DIAGNOSIS:  Carpal tunnel syndrome, right hand.  OPERATION:  Decompression, right median nerve.  SURGEON:  Cindee SaltGary Sevyn Markham, MD.  ASSISTANT:  None.  ANESTHESIA:  Forearm IV regional with IV sedation, local infiltration.  [facility]  ANESTHESIOLOGIST:  Desmond Lopeurk.  HISTORY:  The patient is a 52 year old female with a history of carpal tunnel syndrome and nerve conduction is positive on her right side. This has not responded to conservative treatment.  She has elected to undergo surgical decompression of the median nerve.  Pre, peri, and postoperative course have been discussed along with risks and complications.  She is aware that there is no guarantee to the surgery, the possibility of infection; recurrence of injury to arteries, nerves, and tendons, incomplete relief of symptoms, and dystrophy.  In the preoperative area, the patient is seen, the extremity marked by both patient and surgeon, antibiotic given.  DESCRIPTION OF PROCEDURE:  The patient was brought to the operating room where a forearm-based IV regional anesthetic was carried out without difficulty.  She was prepped using ChloraPrep in supine position with the right arm free.  A 3-minute dry time was allowed and time-out was taken confirming the patient and procedure.  A longitudinal incision was made in the palm and carried down through subcutaneous tissue.  She had some feeling.  A local infiltration with 0.25% bupivacaine without epinephrine was given; approximately 6 mL was used.  The palmar fascia was split  revealing superficial palmar arch and the flexor tendon of the ring and little finger.  Median nerve was then retracted radially and the ulnar nerve ulnarly.  The flexor retinaculum was then released on its ulnar aspect.  A right angle and Sewell retractor were placed between skin and forearm fascia.  The fascia was released for approximately 2-3 cm proximal to the wrist crease under direct vision. The nerve was explored and it was found to be compressed at the level of the hamate hook.  Motor branch entered into muscle distally.  No further lesions were identified.  The wound was copiously irrigated with saline. The skin was closed with interrupted 4-0 nylon sutures. A sterile compressive dressing with the fingers free was applied.  On deflation of the tourniquet, all fingers immediately pinked.  She was taken to the recovery room for observation in satisfactory condition. She will be discharged to home to [facility], on [place].          ______________________________ Cindee SaltGary Adore Kithcart, M.D.     GK/MEDQ  D:  05/18/2017  T:  05/18/2017  Job:  [id number]

## 2017-05-21 ENCOUNTER — Encounter (HOSPITAL_BASED_OUTPATIENT_CLINIC_OR_DEPARTMENT_OTHER): Payer: Self-pay | Admitting: Orthopedic Surgery

## 2017-05-27 ENCOUNTER — Emergency Department (HOSPITAL_COMMUNITY)
Admission: EM | Admit: 2017-05-27 | Discharge: 2017-05-27 | Disposition: A | Discharge status: Home / Self Care | Payer: BLUE CROSS/BLUE SHIELD | Attending: Emergency Medicine | Admitting: Emergency Medicine

## 2017-05-27 ENCOUNTER — Encounter (HOSPITAL_COMMUNITY): Payer: Self-pay | Admitting: *Deleted

## 2017-05-27 ENCOUNTER — Emergency Department (HOSPITAL_COMMUNITY): Payer: BLUE CROSS/BLUE SHIELD

## 2017-05-27 ENCOUNTER — Other Ambulatory Visit: Payer: Self-pay

## 2017-05-27 DIAGNOSIS — R1031 Right lower quadrant pain: Secondary | ICD-10-CM | POA: Insufficient documentation

## 2017-05-27 DIAGNOSIS — Z79899 Other long term (current) drug therapy: Secondary | ICD-10-CM | POA: Diagnosis not present

## 2017-05-27 DIAGNOSIS — R112 Nausea with vomiting, unspecified: Secondary | ICD-10-CM | POA: Diagnosis not present

## 2017-05-27 DIAGNOSIS — R109 Unspecified abdominal pain: Secondary | ICD-10-CM

## 2017-05-27 LAB — URINALYSIS, ROUTINE W REFLEX MICROSCOPIC
BILIRUBIN URINE: NEGATIVE
Bacteria, UA: NONE SEEN
Glucose, UA: NEGATIVE mg/dL
HGB URINE DIPSTICK: NEGATIVE
Ketones, ur: NEGATIVE mg/dL
LEUKOCYTES UA: NEGATIVE
NITRITE: NEGATIVE
PH: 5 (ref 5.0–8.0)
Protein, ur: NEGATIVE mg/dL
Specific Gravity, Urine: 1.016 (ref 1.005–1.030)

## 2017-05-27 LAB — BASIC METABOLIC PANEL
Anion gap: 9 (ref 5–15)
BUN: 9 mg/dL (ref 6–20)
CALCIUM: 8.6 mg/dL — AB (ref 8.9–10.3)
CO2: 28 mmol/L (ref 22–32)
Chloride: 100 mmol/L — ABNORMAL LOW (ref 101–111)
Creatinine, Ser: 0.67 mg/dL (ref 0.44–1.00)
GLUCOSE: 100 mg/dL — AB (ref 65–99)
POTASSIUM: 3.5 mmol/L (ref 3.5–5.1)
Sodium: 137 mmol/L (ref 135–145)

## 2017-05-27 LAB — CBC
HEMATOCRIT: 41.6 % (ref 36.0–46.0)
Hemoglobin: 13.5 g/dL (ref 12.0–15.0)
MCH: 30.1 pg (ref 26.0–34.0)
MCHC: 32.5 g/dL (ref 30.0–36.0)
MCV: 92.9 fL (ref 78.0–100.0)
Platelets: 197 10*3/uL (ref 150–400)
RBC: 4.48 MIL/uL (ref 3.87–5.11)
RDW: 13.1 % (ref 11.5–15.5)
WBC: 5.6 10*3/uL (ref 4.0–10.5)

## 2017-05-27 LAB — I-STAT BETA HCG BLOOD, ED (MC, WL, AP ONLY)

## 2017-05-27 MED ORDER — SODIUM CHLORIDE 0.9 % IV BOLUS (SEPSIS)
1000.0000 mL | Freq: Once | INTRAVENOUS | Status: AC
  Administered 2017-05-27: 1000 mL via INTRAVENOUS

## 2017-05-27 MED ORDER — ONDANSETRON HCL 4 MG PO TABS: 4.0000 mg | ORAL_TABLET | Freq: Three times a day (TID) | ORAL | 0 refills | Status: DC | PRN

## 2017-05-27 NOTE — ED Triage Notes (Signed)
Rt flank pain and vomiting yesterday, continues and pain is more localized in flank

## 2017-05-27 NOTE — ED Provider Notes (Signed)
[facility]-EMERGENCY DEPT Provider Note   CSN: [id number] Arrival date & time: 05/27/17  0854     History   Chief Complaint Chief Complaint  Patient presents with  . Emesis  . Flank Pain    Rt    HPI Valerie Kent is a 52 y.o. female.  The history is provided by the patient. No language interpreter was used.  Emesis    Flank Pain    Valerie Kent is a 52 y.o. female who presents to the Emergency Department complaining of flank pain, vomiting.  Over the last two days she has experienced body aches and yesterday developed N/V with multiple episodes of emesis.  She has chronic right flank pain and this has been worse over the last two days.  She denies fevers but did have chills.  No diarrhea, dysuria, numbness, weakness, abdominal pain, cough.  No prior similar sxs.  She had carpal tunnel surgery performed one week ago with no complications, surgery site is without pain or swelling.   Past Medical History:  Diagnosis Date  . Anxiety   . Bipolar disorder (HCC)   . Depression   . Fibromyalgia   . Gastritis   . IBS (irritable bowel syndrome)   . Migraines    verapamil    Patient Active Problem List   Diagnosis Date Noted  . Varicose veins of both lower extremities with pain   . GASTROINTESTINAL XRAY, ABNORMAL 04/27/2008  . GASTROENTERITIS, VIRAL, ACUTE 04/23/2008  . GERD 03/26/2008  . ABDOMINAL PAIN-EPIGASTRIC 03/26/2008    Past Surgical History:  Procedure Laterality Date  . CARPAL TUNNEL RELEASE Right 05/18/2017   Procedure: RIGHT CARPAL TUNNEL RELEASE;  Surgeon: Cindee Salt, MD;  Location: [facility];  Service: Orthopedics;  Laterality: Right;  . DILATION AND CURETTAGE OF UTERUS    . FINGER SURGERY Left    ring finger cyst removal  . MASS EXCISION Left 01/19/2017   Procedure: EXCISION MUCOID CYST DEBRIDEMENT DISTAL INTERPHaLANGEAL JOINT;  Surgeon: Cindee Salt, MD;  Location: [facility];  Service: Orthopedics;   Laterality: Left;  FAB     OB History   None      Home Medications    Prior to Admission medications   Medication Sig Start Date End Date Taking? Authorizing Provider  acetaminophen (TYLENOL) 500 MG tablet Take 1,000 mg by mouth every 6 (six) hours as needed for moderate pain or headache.   Yes [provider]  calcium citrate-vitamin D (CITRACAL+D) 315-200 MG-UNIT per tablet Take 1 tablet by mouth 2 (two) times daily.   Yes [provider]  DULoxetine (CYMBALTA) 30 MG capsule TAKE 1 TABLET BY MOUTH EVERY DAY PLUS 60MG  FOR TOTAL OF 90MG  *INSURANCE ALLOWS 30 DAY FILLS 05/15/17  Yes [provider]  DULoxetine (CYMBALTA) 60 MG capsule Take 60 mg by mouth daily. Take along with 30 mg capsule=90 mg   Yes [provider]  ibuprofen (ADVIL,MOTRIN) 200 MG tablet Take 600 mg by mouth every 6 (six) hours as needed for moderate pain.   Yes [provider]  lamoTRIgine (LAMICTAL) 150 MG tablet Take 150 mg by mouth at bedtime.  04/22/17  Yes [provider]  MULTIPLE VITAMIN PO Take 1 tablet by mouth daily.   Yes [provider]  Omega-3 Fatty Acids (FISH OIL) 1000 MG CAPS Take 1 capsule by mouth daily.   Yes [provider]  verapamil (CALAN-SR) 240 MG CR tablet Take 480 mg by mouth at bedtime. 04/19/17  Yes [provider]  zolpidem (AMBIEN) 10 MG tablet Take 10 mg by mouth at bedtime.    Yes [provider]  HYDROcodone-acetaminophen (NORCO) 5-325 MG tablet Take 1 tablet by mouth every 6 (six) hours as needed. Patient taking differently: Take 1 tablet by mouth every 6 (six) hours as needed for severe pain.  05/18/17   Cindee Salt, MD  ondansetron (ZOFRAN) 4 MG tablet Take 1 tablet (4 mg total) by mouth every 8 (eight) hours as needed for nausea or vomiting. 05/27/17   Tilden Fossa, MD    Family History No family history on file.  Social History Social History   Tobacco Use  . Smoking status: Never Smoker   . Smokeless tobacco: Never Used  Substance Use Topics  . Alcohol use: Yes    Alcohol/week: 0.0 oz    Comment: occasional glass of wine   . Drug use: No     Allergies   Patient has no known allergies.   Review of Systems Review of Systems  Gastrointestinal: Positive for vomiting.  Genitourinary: Positive for flank pain.  All other systems reviewed and are negative.    Physical Exam Updated Vital Signs BP 114/63   Pulse 71   Temp 98.4 F (36.9 C) (Oral)   Resp 16   Ht 5\' 1"  (1.549 m)   Wt 53.5 kg (118 lb)   LMP 02/24/2016   SpO2 100%   BMI 22.30 kg/m   Physical Exam  Constitutional: She is oriented to person, place, and time. She appears well-developed and well-nourished.  HENT:  Head: Normocephalic and atraumatic.  Cardiovascular: Normal rate and regular rhythm.  No murmur heard. Pulmonary/Chest: Effort normal and breath sounds normal. No respiratory distress.  Abdominal: Soft. There is no tenderness. There is no rebound and no guarding.  No cva tenderness  Musculoskeletal: She exhibits no edema or tenderness.  Neurological: She is alert and oriented to person, place, and time.  Skin: Skin is warm and dry.  Psychiatric: She has a normal mood and affect. Her behavior is normal.  Nursing note and vitals reviewed.    ED Treatments / Results  Labs (all labs ordered are listed, but only abnormal results are displayed) Labs Reviewed  URINALYSIS, ROUTINE W REFLEX MICROSCOPIC - Abnormal; Notable for the following components:      Result Value   Squamous Epithelial / LPF 0-5 (*)    All other components within normal limits  BASIC METABOLIC PANEL - Abnormal; Notable for the following components:   Chloride 100 (*)    Glucose, Bld 100 (*)    Calcium 8.6 (*)    All other components within normal limits  URINE CULTURE  CBC  I-STAT BETA HCG BLOOD, ED ([facility], [facility], [facility] ONLY)    EKG None  Radiology Ct Renal Stone Study  Result Date: 05/27/2017 CLINICAL DATA:   Right flank pain and vomiting yesterday with persistent pain today. EXAM: CT ABDOMEN AND PELVIS WITHOUT CONTRAST TECHNIQUE: Multidetector CT imaging of the abdomen and pelvis was performed following the standard protocol without IV contrast. COMPARISON:  08/06/2008 FINDINGS: Lower chest: Lung bases are normal. Hepatobiliary: Subcentimeter hypodensity over the right dome of the liver unchanged likely a cyst or hemangioma. Gallbladder and biliary tree are normal. Pancreas: Normal. Spleen: Normal. Adrenals/Urinary Tract: Adrenal glands are normal. Kidneys are normal in size without hydronephrosis or nephrolithiasis. Ureters and bladder are normal. Stomach/Bowel: Stomach and small bowel are within normal. Appendix is normal. Colon is within normal. Vascular/Lymphatic: Very minimal calcified  plaque over the abdominal aorta. No adenopathy. Reproductive: Mildly enlarged uterus extending to the right of midline with several masses compatible with leiomyomas. Ovaries unremarkable. Other: No free fluid or focal inflammatory change. Musculoskeletal: Normal. IMPRESSION: No acute findings in the abdomen/pelvis. Mildly enlarged leiomyomatous uterus. Very minimal aortic atherosclerosis (ICD10-I70.0). Stable subcentimeter hypodensity over the dome of the right lobe of the liver likely a hemangioma or cyst. Electronically Signed   By: Elberta Fortisaniel  Boyle M.D.   On: 05/27/2017 14:21    Procedures Procedures (including critical care time)  Medications Ordered in ED Medications  sodium chloride 0.9 % bolus 1,000 mL (0 mLs Intravenous Stopped 05/27/17 1520)     Initial Impression / Assessment and Plan / ED Course  I have reviewed the triage vital signs and the nursing notes.  Pertinent labs & imaging results that were available during my care of the patient were reviewed by me and considered in my medical decision making (see chart for details).    Pt here for evaluation of right flank pain (acute on chronic) as well as  vomiting (acute).   She is nontoxic appearing on exam, no significant abdominal tenderness.  Initial concern for renal colic vs UTI.  Patient declined pain meds, antiemetics in the ED.  BMP with no acute abnormality, UA without evidence of UTI.  CT negative for obstructing stone or acute inflammatory process.  She is feeling improved on recheck following IVF.  D/w pt unclear source of her sxs.  Plan to d/c home with outpatient follow up and return precautions.     Final Clinical Impressions(s) / ED Diagnoses   Final diagnoses:  Right flank pain  Non-intractable vomiting with nausea, unspecified vomiting type    ED Discharge Orders        Ordered    ondansetron (ZOFRAN) 4 MG tablet  Every 8 hours PRN     05/27/17 1519       Tilden Fossaees, Shanika Levings, MD 05/27/17 1946

## 2017-05-27 NOTE — Discharge Instructions (Addendum)
The cause of your pain was not identified today.  Please follow up with your doctor later this week for recheck. Get seen immediately if you develop severe pain, uncontrolled vomiting or new concerning symptoms.

## 2017-05-28 LAB — URINE CULTURE: Culture: <10000 — AB

## 2018-02-05 ENCOUNTER — Encounter: Payer: Self-pay | Admitting: Emergency Medicine

## 2018-02-05 DIAGNOSIS — F411 Generalized anxiety disorder: Secondary | ICD-10-CM

## 2018-02-05 DIAGNOSIS — F3181 Bipolar II disorder: Secondary | ICD-10-CM

## 2018-02-05 DIAGNOSIS — G4700 Insomnia, unspecified: Secondary | ICD-10-CM

## 2018-02-15 ENCOUNTER — Other Ambulatory Visit: Payer: Self-pay

## 2018-02-15 MED ORDER — DULOXETINE HCL 60 MG PO CPEP: 60.0000 mg | ORAL_CAPSULE | Freq: Every day | ORAL | 1 refills | Status: DC

## 2018-02-18 ENCOUNTER — Ambulatory Visit: Payer: BLUE CROSS/BLUE SHIELD | Admitting: Physician Assistant

## 2018-02-18 ENCOUNTER — Encounter: Payer: Self-pay | Admitting: Physician Assistant

## 2018-02-18 DIAGNOSIS — G4700 Insomnia, unspecified: Secondary | ICD-10-CM

## 2018-02-18 DIAGNOSIS — F331 Major depressive disorder, recurrent, moderate: Secondary | ICD-10-CM

## 2018-02-18 DIAGNOSIS — F411 Generalized anxiety disorder: Secondary | ICD-10-CM

## 2018-02-18 MED ORDER — ALPRAZOLAM 0.25 MG PO TABS: 0.2500 mg | ORAL_TABLET | Freq: Every evening | ORAL | 0 refills | Status: DC | PRN

## 2018-02-18 MED ORDER — ZOLPIDEM TARTRATE 10 MG PO TABS: 10.0000 mg | ORAL_TABLET | Freq: Every evening | ORAL | 5 refills | Status: DC | PRN

## 2018-02-18 MED ORDER — LAMOTRIGINE 150 MG PO TABS: 150.0000 mg | ORAL_TABLET | Freq: Every day | ORAL | 1 refills | Status: DC

## 2018-02-18 MED ORDER — DULOXETINE HCL 30 MG PO CPEP: 30.0000 mg | ORAL_CAPSULE | Freq: Every day | ORAL | 1 refills | Status: DC

## 2018-02-18 MED ORDER — DULOXETINE HCL 60 MG PO CPEP: 60.0000 mg | ORAL_CAPSULE | Freq: Every day | ORAL | 1 refills | Status: DC

## 2018-02-18 NOTE — Progress Notes (Signed)
[facility] Med Check  Patient ID: Fonnie JarvisZoraida Meinhardt,  MRN: [id number]  PCP: Georgianne Fickamachandran, Ajith, MD  Date of Evaluation: 02/18/2018 Time spent:15 minutes  Chief Complaint:  Chief Complaint    Follow-up      HISTORY/CURRENT STATUS: HPI Here for 6 month med check.  Patient denies loss of interest in usual activities and is able to enjoy things.  Denies decreased energy or motivation.  Appetite has not changed.  No extreme sadness, tearfulness, or feelings of hopelessness.  Denies any changes in concentration, making decisions or remembering things.  Denies suicidal or homicidal thoughts.  Anxiety is well controlled.  On occasion she has to take Xanax.  She and her family will be flying to [place] for vacation next week.  States she knows she will have to take the Xanax then because she does not like to fly.  She sleeps well as long as she has the Ambien.  (Paper chart is not available to me at present.  I do not have a list of prior psych meds that have been used.)  Individual Medical History/ Review of Systems: Changes? :No   Allergies: Patient has no known allergies.  Current Medications:  Current Outpatient Medications:  .  acetaminophen (TYLENOL) 500 MG tablet, Take 1,000 mg by mouth every 6 (six) hours as needed for moderate pain or headache., Disp: , Rfl:  .  calcium citrate-vitamin D (CITRACAL+D) 315-200 MG-UNIT per tablet, Take 1 tablet by mouth 2 (two) times daily., Disp: , Rfl:  .  DULoxetine (CYMBALTA) 30 MG capsule, TAKE 1 TABLET BY MOUTH EVERY DAY PLUS 60MG  FOR TOTAL OF 90MG  *INSURANCE ALLOWS 30 DAY FILLS, Disp: , Rfl: 1 .  DULoxetine (CYMBALTA) 60 MG capsule, Take 1 capsule (60 mg total) by mouth daily. Take along with 30 mg capsule=90 mg, Disp: 30 capsule, Rfl: 1 .  glucosamine-chondroitin 500-400 MG tablet, Take 1 tablet by mouth 3 (three) times daily., Disp: , Rfl:  .  ibuprofen (ADVIL,MOTRIN) 200 MG tablet, Take 600 mg by mouth every 6 (six) hours as needed for  moderate pain., Disp: , Rfl:  .  lamoTRIgine (LAMICTAL) 150 MG tablet, Take 150 mg by mouth at bedtime. , Disp: , Rfl: 1 .  MULTIPLE VITAMIN PO, Take 1 tablet by mouth daily., Disp: , Rfl:  .  Omega-3 Fatty Acids (FISH OIL) 1000 MG CAPS, Take 1 capsule by mouth daily., Disp: , Rfl:  .  TURMERIC PO, Take by mouth., Disp: , Rfl:  .  verapamil (CALAN-SR) 240 MG CR tablet, Take 480 mg by mouth at bedtime., Disp: , Rfl: 3 .  zolpidem (AMBIEN) 10 MG tablet, Take 10 mg by mouth at bedtime. , Disp: , Rfl:  Medication Side Effects: none  Family Medical/ Social History: Changes? No  MENTAL HEALTH EXAM:  Last menstrual period 02/24/2016.There is no height or weight on file to calculate BMI.  General Appearance: Casual and Well Groomed  Eye Contact:  Good  Speech:  Clear and Coherent  Volume:  Normal  Mood:  Euthymic  Affect:  Appropriate  Thought Process:  Goal Directed  Orientation:  Full (Time, Place, and Person)  Thought Content: Logical   Suicidal Thoughts:  No  Homicidal Thoughts:  No  Memory:  WNL  Judgement:  Good  Insight:  Good  Psychomotor Activity:  Normal  Concentration:  Concentration: Good  Recall:  Good  Fund of Knowledge: Good  Language: Good  Assets:  Desire for Improvement  ADL's:  Intact  Cognition: WNL  Prognosis:  Good    DIAGNOSES:    ICD-10-CM   1. Major depressive disorder, recurrent episode, moderate (HCC) F33.1   2. GAD (generalized anxiety disorder) F41.1   3. Insomnia, unspecified type G47.00     Receiving Psychotherapy: No    RECOMMENDATIONS: Continue current medications. Return in 6 months or sooner as needed.  Melony Overly, PA-C

## 2018-08-19 ENCOUNTER — Other Ambulatory Visit: Payer: Self-pay

## 2018-08-19 ENCOUNTER — Ambulatory Visit (INDEPENDENT_AMBULATORY_CARE_PROVIDER_SITE_OTHER): Payer: BC Managed Care – PPO | Admitting: Physician Assistant

## 2018-08-19 ENCOUNTER — Encounter: Payer: Self-pay | Admitting: Physician Assistant

## 2018-08-19 DIAGNOSIS — G4700 Insomnia, unspecified: Secondary | ICD-10-CM

## 2018-08-19 DIAGNOSIS — F411 Generalized anxiety disorder: Secondary | ICD-10-CM | POA: Diagnosis not present

## 2018-08-19 DIAGNOSIS — F331 Major depressive disorder, recurrent, moderate: Secondary | ICD-10-CM

## 2018-08-19 MED ORDER — LAMOTRIGINE 150 MG PO TABS: 150.0000 mg | ORAL_TABLET | Freq: Every day | ORAL | 1 refills | Status: DC

## 2018-08-19 MED ORDER — ZOLPIDEM TARTRATE 10 MG PO TABS: 10.0000 mg | ORAL_TABLET | Freq: Every evening | ORAL | 5 refills | Status: DC | PRN

## 2018-08-19 MED ORDER — DULOXETINE HCL 60 MG PO CPEP: 120.0000 mg | ORAL_CAPSULE | Freq: Every day | ORAL | 1 refills | Status: DC

## 2018-08-19 MED ORDER — ALPRAZOLAM 0.25 MG PO TABS: 0.2500 mg | ORAL_TABLET | Freq: Every evening | ORAL | 5 refills | Status: DC | PRN
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
## 2018-09-30 ENCOUNTER — Ambulatory Visit (INDEPENDENT_AMBULATORY_CARE_PROVIDER_SITE_OTHER): Payer: BC Managed Care – PPO | Admitting: Physician Assistant

## 2018-09-30 ENCOUNTER — Encounter: Payer: Self-pay | Admitting: Physician Assistant

## 2018-09-30 ENCOUNTER — Other Ambulatory Visit: Payer: Self-pay

## 2018-09-30 DIAGNOSIS — F331 Major depressive disorder, recurrent, moderate: Secondary | ICD-10-CM

## 2018-09-30 DIAGNOSIS — G4700 Insomnia, unspecified: Secondary | ICD-10-CM

## 2018-09-30 DIAGNOSIS — F411 Generalized anxiety disorder: Secondary | ICD-10-CM

## 2018-09-30 NOTE — Progress Notes (Signed)
[facility]  Patient ID: Valerie Kent,  MRN: [id number]  PCP: Merrilee Seashore, MD  Date of Evaluation: 09/30/2018 Time spent:15 minutes  Chief Complaint:  Chief Complaint    Depression; Follow-up     Virtual Visit via Telephone Note  I connected with patient by a video enabled telemedicine application or telephone, with their informed consent, and verified patient privacy and that I am speaking with the correct person using two identifiers.  I am private, in my home and the patient is home.   I discussed the limitations, risks, security and privacy concerns of performing an evaluation and management service by telephone and the availability of in person appointments. I also discussed with the patient that there may be a patient responsible charge related to this service. The patient expressed understanding and agreed to proceed.   I discussed the assessment and treatment plan with the patient. The patient was provided an opportunity to ask questions and all were answered. The patient agreed with the plan and demonstrated an understanding of the instructions.   The patient was advised to call back or seek an in-person evaluation if the symptoms worsen or if the condition fails to improve as anticipated.  I provided 15 minutes of non-face-to-face time during this encounter.  HISTORY/CURRENT STATUS: HPI for 6-week med check.  At the last visit we increased the Cymbalta.  She states her motivation and energy is some better and her mood is good.  She does not feel sad all the time.  She is not isolating any more than is required because of the pandemic.  She is concerned about the coronavirus though.  Her husband is a Pharmacist, community and is exposed to different people all the time.  She is a little worried about him getting sick.  She denies suicidal or homicidal thoughts.  She sleeps well.  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  Hygiene is good.  Appetite is good.  Denies increased energy  with decreased need for sleep.  No increased irritability, no increased spending or libido, no impulsivity or risky behavior.  No hallucinations.  She continues to have chronic pain.  Under the care of neurologist also.  They are hopeful that the Cymbalta will help the pain as well as her mood.  Denies dizziness, syncope, seizures, numbness, tingling, tremor, tics, unsteady gait, slurred speech, confusion.  No dystonia.  Individual Medical History/ Review of Systems: Changes? :No    Past medications for mental health diagnoses include: Paxil, Wellbutrin, Cymbalta, Xanax, Depakote, Lamictal, Tegretol, Prozac, Zoloft, Lexapro, Luvox, Viibryd, Ambien  Allergies: Patient has no known allergies.  Current Medications:  Current Outpatient Medications:  .  acetaminophen (TYLENOL) 500 MG tablet, Take 1,000 mg by mouth every 6 (six) hours as needed for moderate pain or headache., Disp: , Rfl:  .  ALPRAZolam (XANAX) 0.25 MG tablet, Take 1 tablet (0.25 mg total) by mouth at bedtime as needed for anxiety., Disp: 30 tablet, Rfl: 5 .  calcium citrate-vitamin D (CITRACAL+D) 315-200 MG-UNIT per tablet, Take 1 tablet by mouth 2 (two) times daily., Disp: , Rfl:  .  DULoxetine (CYMBALTA) 60 MG capsule, Take 2 capsules (120 mg total) by mouth daily., Disp: 180 capsule, Rfl: 1 .  glucosamine-chondroitin 500-400 MG tablet, Take 1 tablet by mouth 3 (three) times daily., Disp: , Rfl:  .  ibuprofen (ADVIL,MOTRIN) 200 MG tablet, Take 600 mg by mouth every 6 (six) hours as needed for moderate pain., Disp: , Rfl:  .  lamoTRIgine (LAMICTAL) 150 MG tablet, Take  1 tablet (150 mg total) by mouth at bedtime., Disp: 90 tablet, Rfl: 1 .  meloxicam (MOBIC) 15 MG tablet, Take 15 mg by mouth daily., Disp: , Rfl:  .  MULTIPLE VITAMIN PO, Take 1 tablet by mouth daily., Disp: , Rfl:  .  Omega-3 Fatty Acids (FISH OIL) 1000 MG CAPS, Take 1 capsule by mouth daily., Disp: , Rfl:  .  TURMERIC PO, Take by mouth., Disp: , Rfl:  .   verapamil (CALAN-SR) 240 MG CR tablet, Take 480 mg by mouth at bedtime., Disp: , Rfl: 3 .  zolpidem (AMBIEN) 10 MG tablet, Take 1 tablet (10 mg total) by mouth at bedtime as needed for sleep., Disp: 30 tablet, Rfl: 5 Medication Side Effects: none  Family Medical/ Social History: Changes? No  MENTAL HEALTH EXAM:  Last menstrual period 02/24/2016.There is no height or weight on file to calculate BMI.  General Appearance: Unable to assess  Eye Contact:  Unable to assess  Speech:  Clear and Coherent  Volume:  Normal  Mood:  Euthymic  Affect:  Unable to assess  Thought Process:  Goal Directed  Orientation:  Full (Time, Place, and Person)  Thought Content: Logical   Suicidal Thoughts:  No  Homicidal Thoughts:  No  Memory:  WNL  Judgement:  Good  Insight:  Good  Psychomotor Activity:  Unable to assess  Concentration:  Concentration: Good  Recall:  Good  Fund of Knowledge: Good  Language: Good  Assets:  Desire for Improvement  ADL's:  Intact  Cognition: WNL  Prognosis:  Good    DIAGNOSES:    ICD-10-CM   1. Major depressive disorder, recurrent episode, moderate (HCC)  F33.1   2. GAD (generalized anxiety disorder)  F41.1   3. Insomnia, unspecified type  G47.00     Receiving Psychotherapy: No    RECOMMENDATIONS:  Continue Xanax 0.25 mg nightly as needed.  PDMP was reviewed. Continue Cymbalta 60 mg, 2 p.o. daily. Continue Lamictal 150 mg nightly. Continue Ambien 10 mg nightly as needed. Return in 3 months.  Melony Overlyeresa Hurst, PA-C   This record has been created using [employer] software.  Chart creation errors have been sought, but may not always have been located and corrected. Such creation errors do not reflect on the standard of medical care.

## 2018-12-31 ENCOUNTER — Ambulatory Visit: Payer: BC Managed Care – PPO | Admitting: Physician Assistant

## 2019-01-22 ENCOUNTER — Ambulatory Visit: Payer: BC Managed Care – PPO | Admitting: Physician Assistant

## 2019-02-04 ENCOUNTER — Other Ambulatory Visit: Payer: Self-pay | Admitting: Physician Assistant

## 2019-03-16 ENCOUNTER — Other Ambulatory Visit: Payer: Self-pay | Admitting: Physician Assistant

## 2019-03-16 NOTE — Telephone Encounter (Signed)
Apt 01/12

## 2019-03-18 ENCOUNTER — Ambulatory Visit: Payer: BC Managed Care – PPO | Admitting: Physician Assistant

## 2019-03-26 ENCOUNTER — Other Ambulatory Visit: Payer: Self-pay | Admitting: Physician Assistant

## 2019-04-29 ENCOUNTER — Encounter: Payer: Self-pay | Admitting: Physician Assistant

## 2019-04-29 ENCOUNTER — Ambulatory Visit (INDEPENDENT_AMBULATORY_CARE_PROVIDER_SITE_OTHER): Payer: BC Managed Care – PPO | Admitting: Physician Assistant

## 2019-04-29 DIAGNOSIS — F331 Major depressive disorder, recurrent, moderate: Secondary | ICD-10-CM

## 2019-04-29 DIAGNOSIS — F411 Generalized anxiety disorder: Secondary | ICD-10-CM | POA: Diagnosis not present

## 2019-04-29 DIAGNOSIS — G4700 Insomnia, unspecified: Secondary | ICD-10-CM

## 2019-04-29 MED ORDER — LAMOTRIGINE 150 MG PO TABS: 150.0000 mg | ORAL_TABLET | Freq: Every day | ORAL | 5 refills | Status: DC

## 2019-04-29 MED ORDER — DULOXETINE HCL 30 MG PO CPEP: 30.0000 mg | ORAL_CAPSULE | Freq: Every day | ORAL | 5 refills | Status: DC

## 2019-04-29 MED ORDER — DULOXETINE HCL 60 MG PO CPEP: 60.0000 mg | ORAL_CAPSULE | Freq: Every day | ORAL | 5 refills | Status: DC

## 2019-04-29 MED ORDER — ALPRAZOLAM 0.25 MG PO TABS: 0.2500 mg | ORAL_TABLET | Freq: Every evening | ORAL | 5 refills | Status: DC | PRN

## 2019-04-29 MED ORDER — MELATONIN 3-10 MG PO TABS: 5.0000 mg | ORAL_TABLET | Freq: Every evening | ORAL | 0 refills | Status: AC | PRN

## 2019-04-29 NOTE — Progress Notes (Signed)
[facility]  Patient ID: Valerie Kent,  MRN: [id number]  PCP: Merrilee Seashore, MD  Date of Evaluation: 04/29/2019 Time spent:20 minutes  Chief Complaint:  Chief Complaint    Anxiety; Depression; Insomnia; Medication Refill     Virtual Visit via Telephone Note  I connected with patient by a video enabled telemedicine application or telephone, with their informed consent, and verified patient privacy and that I am speaking with the correct person using two identifiers.  I am private, in my office and the patient is home.   I discussed the limitations, risks, security and privacy concerns of performing an evaluation and management service by telephone and the availability of in person appointments. I also discussed with the patient that there may be a patient responsible charge related to this service. The patient expressed understanding and agreed to proceed.   I discussed the assessment and treatment plan with the patient. The patient was provided an opportunity to ask questions and all were answered. The patient agreed with the plan and demonstrated an understanding of the instructions.   The patient was advised to call back or seek an in-person evaluation if the symptoms worsen or if the condition fails to improve as anticipated.  I provided 20 minutes of non-face-to-face time during this encounter.  HISTORY/CURRENT STATUS: HPI for routine med check.  Not sleeping as well. She was taking Ambien 5 mg but for the past 2 nights she has increased to 10 mg.  It still has not helped very well yet.  She is wondering if she can add melatonin.  She has not tried that yet.  She complains of feeling a little dizzy at times.  States that if she turns her head really fast, she feels woozy.  She has never passed out.  The dizziness only last for a few seconds.  She noticed this when we increased the Cymbalta last year.  Patient denies loss of interest in usual activities and is  able to enjoy things.  Denies decreased energy or motivation.  Appetite has not changed.  No extreme sadness, tearfulness, or feelings of hopelessness.  Denies any changes in concentration, making decisions or remembering things.  Denies suicidal or homicidal thoughts.  Denies increased energy with decreased need for sleep.  No increased irritability, no increased spending or libido, no impulsivity or risky behavior.  No hallucinations.  She continues to have chronic pain.  Under the care of neurologist also.  They are hopeful that the Cymbalta will help the pain as well as her mood.  Denies syncope, seizures, numbness, tingling, tremor, tics, unsteady gait, slurred speech, confusion.  No dystonia.  Individual Medical History/ Review of Systems: Changes? :No    Past medications for mental health diagnoses include: Paxil, Wellbutrin, Cymbalta, Xanax, Depakote, Lamictal, Tegretol, Prozac, Zoloft, Lexapro, Luvox, Viibryd, Ambien  Allergies: Patient has no known allergies.  Current Medications:  Current Outpatient Medications:  .  acetaminophen (TYLENOL) 500 MG tablet, Take 1,000 mg by mouth every 6 (six) hours as needed for moderate pain or headache., Disp: , Rfl:  .  ALPRAZolam (XANAX) 0.25 MG tablet, Take 1 tablet (0.25 mg total) by mouth at bedtime as needed for anxiety., Disp: 30 tablet, Rfl: 5 .  calcium citrate-vitamin D (CITRACAL+D) 315-200 MG-UNIT per tablet, Take 1 tablet by mouth 2 (two) times daily., Disp: , Rfl:  .  DULoxetine (CYMBALTA) 60 MG capsule, Take 1 capsule (60 mg total) by mouth daily. Take w/ the 30 mg=90mg , Disp: 60 capsule, Rfl: 5 .  glucosamine-chondroitin 500-400 MG tablet, Take 1 tablet by mouth 3 (three) times daily., Disp: , Rfl:  .  ibuprofen (ADVIL,MOTRIN) 200 MG tablet, Take 600 mg by mouth every 6 (six) hours as needed for moderate pain., Disp: , Rfl:  .  lamoTRIgine (LAMICTAL) 150 MG tablet, Take 1 tablet (150 mg total) by mouth at bedtime., Disp: 30 tablet,  Rfl: 5 .  magnesium 30 MG tablet, Take 30 mg by mouth 2 (two) times daily., Disp: , Rfl:  .  MULTIPLE VITAMIN PO, Take 1 tablet by mouth daily., Disp: , Rfl:  .  Omega-3 Fatty Acids (FISH OIL) 1000 MG CAPS, Take 1 capsule by mouth daily., Disp: , Rfl:  .  TURMERIC PO, Take by mouth., Disp: , Rfl:  .  verapamil (CALAN-SR) 240 MG CR tablet, Take 480 mg by mouth at bedtime., Disp: , Rfl: 3 .  zolpidem (AMBIEN) 10 MG tablet, TAKE ONE TABLET BY MOUTH EVERY NIGHT AT BEDTIME AS NEEDED FOR SLEEP, Disp: 30 tablet, Rfl: 4 .  DULoxetine (CYMBALTA) 30 MG capsule, Take 1 capsule (30 mg total) by mouth daily. Take w/ 60 mg=90 mg, Disp: 30 capsule, Rfl: 5 .  meloxicam (MOBIC) 15 MG tablet, Take 15 mg by mouth daily., Disp: , Rfl:  Medication Side Effects: dizziness/lightheadedness  Family Medical/ Social History: Changes? No  MENTAL HEALTH EXAM:  Last menstrual period 02/24/2016.There is no height or weight on file to calculate BMI.  General Appearance: Unable to assess  Eye Contact:  Unable to assess  Speech:  Clear and Coherent  Volume:  Normal  Mood:  Euthymic  Affect:  Unable to assess  Thought Process:  Goal Directed and Descriptions of Associations: Intact  Orientation:  Full (Time, Place, and Person)  Thought Content: Logical   Suicidal Thoughts:  No  Homicidal Thoughts:  No  Memory:  WNL  Judgement:  Good  Insight:  Good  Psychomotor Activity:  Unable to assess  Concentration:  Concentration: Good  Recall:  Good  Fund of Knowledge: Good  Language: Good  Assets:  Desire for Improvement  ADL's:  Intact  Cognition: WNL  Prognosis:  Good    DIAGNOSES:    ICD-10-CM   1. Insomnia, unspecified type  G47.00   2. Major depressive disorder, recurrent episode, moderate (HCC)  F33.1   3. GAD (generalized anxiety disorder)  F41.1     Receiving Psychotherapy: No    RECOMMENDATIONS:   PDMP was reviewed. I spent 20 minutes with her. We discussed her symptoms of lightheadedness.  It  does sound like it is related to the Cymbalta and we agreed to decrease the dose.  She has no depression symptoms right now and she did well on the 90 mg for a long time. Continue Xanax 0.25 mg nightly as needed.   Decrease Cymbalta to a total of 90 mg daily. Continue Lamictal 150 mg nightly. Continue Ambien 10 mg nightly as needed.  If this is ineffective after about a week, she will call and I will call in [place].  We have already discussed this. Start melatonin 5 mg 1/2 to 2 pills nightly as needed. Return in 6 months.  Melony Overly, PA-C

## 2019-09-16 IMAGING — CT CT RENAL STONE PROTOCOL
2 of 4 series · 16 of 46 positions shown, 18 images · non-contrast
Comparison: 08/06/2008

CLINICAL DATA: Right flank pain and vomiting yesterday with
persistent pain today.

EXAM:
CT ABDOMEN AND PELVIS WITHOUT CONTRAST
TECHNIQUE: Multidetector CT imaging of the abdomen and pelvis was performed
following the standard protocol without IV contrast.

[Series 2: axial st · axial · 0.65mm/px · z∈[+709,+1039]mm · 13 of 76 slices shown, 15 images]
[im 5/76  soft-tissue]
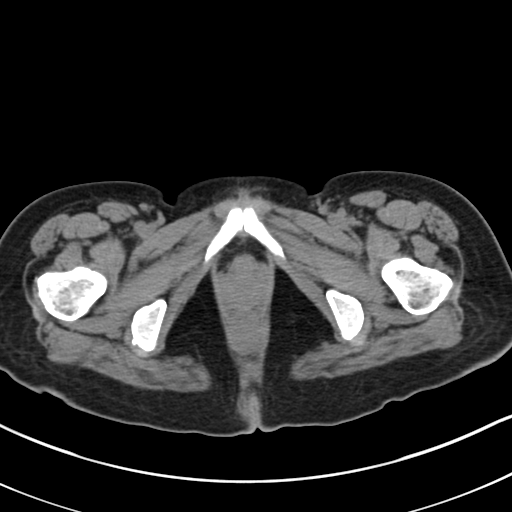
[im 5/76  bone]
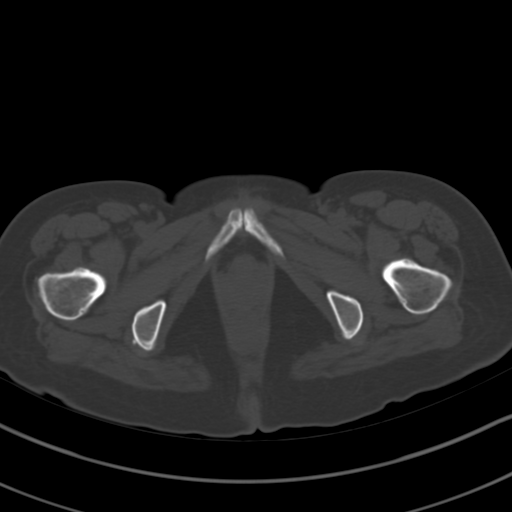
[im 9/76  soft-tissue]
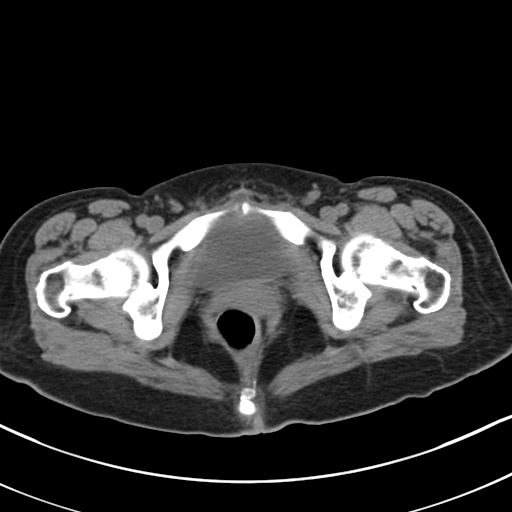
[im 18/76  soft-tissue]
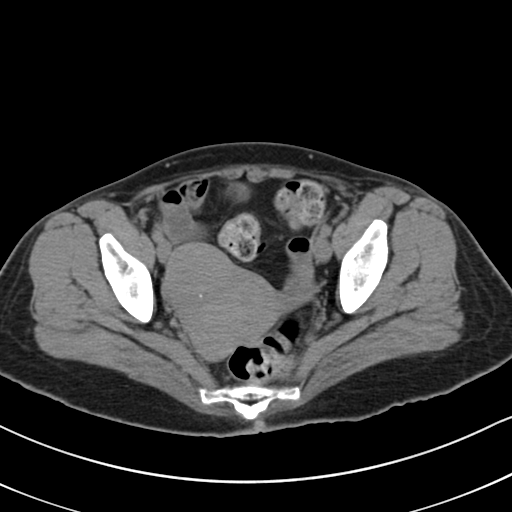
[im 23/76  soft-tissue]
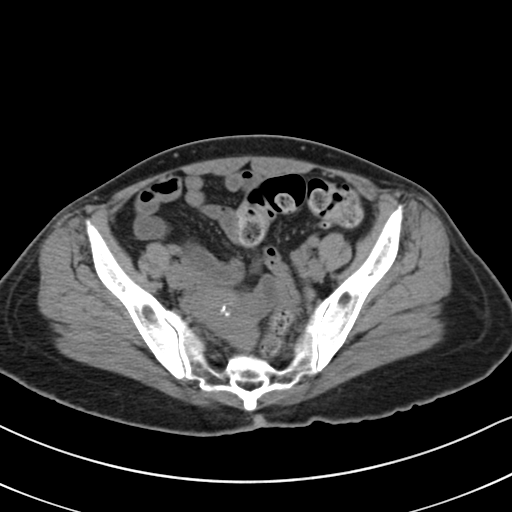
[im 27/76  soft-tissue]
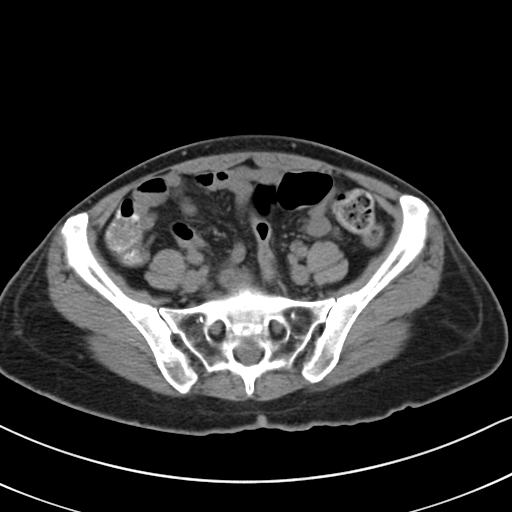
[im 31/76  soft-tissue]
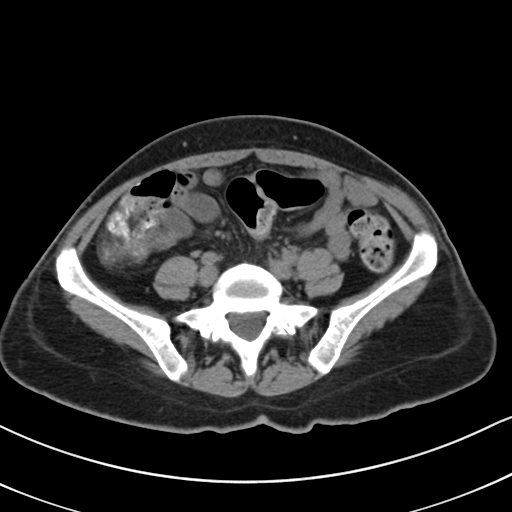
[im 40/76  soft-tissue]
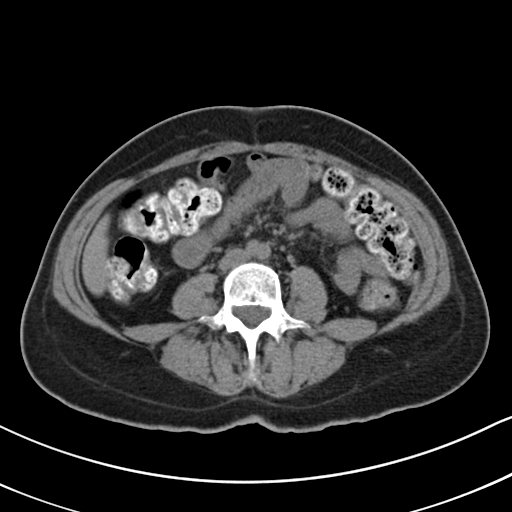
[im 45/76  soft-tissue]
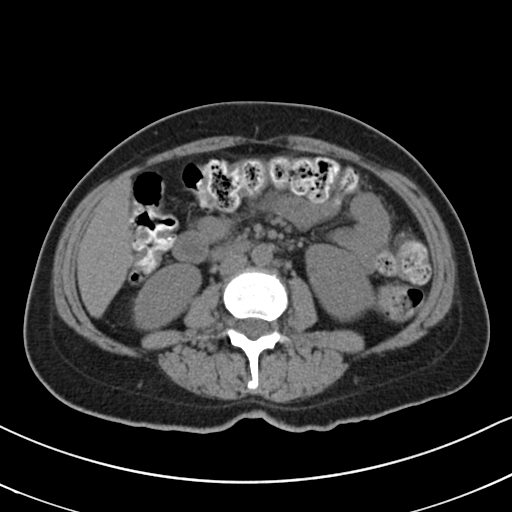
[im 49/76  soft-tissue]
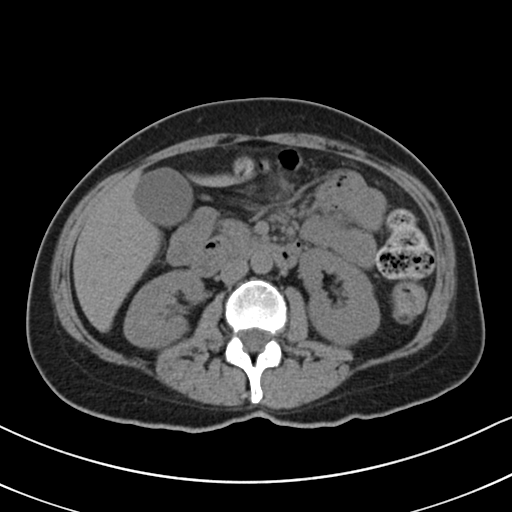
[im 49/76  bone]
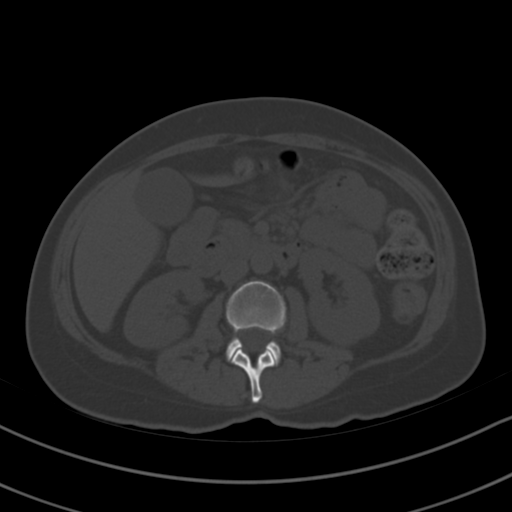
[im 53/76  soft-tissue]
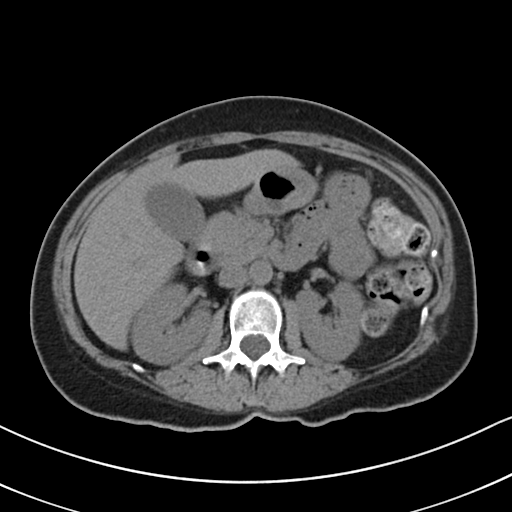
[im 58/76  soft-tissue]
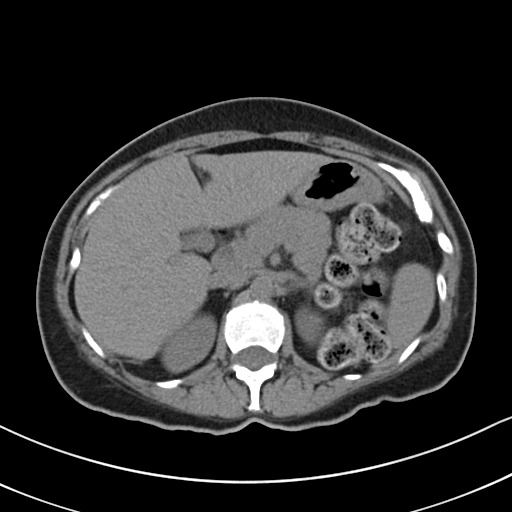
[im 67/76  soft-tissue]
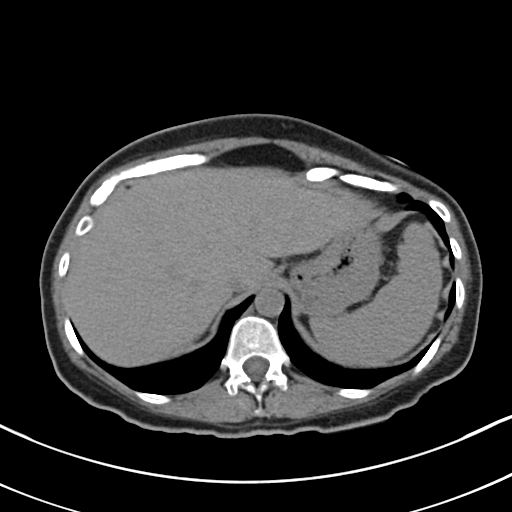
[im 71/76  soft-tissue]
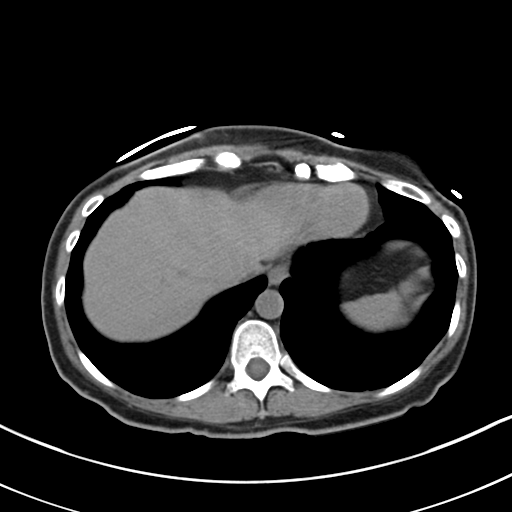

[Series 5: coronal · coronal · 0.63mm/px · 3 of 146 slices shown]
[im 49/146  soft-tissue]
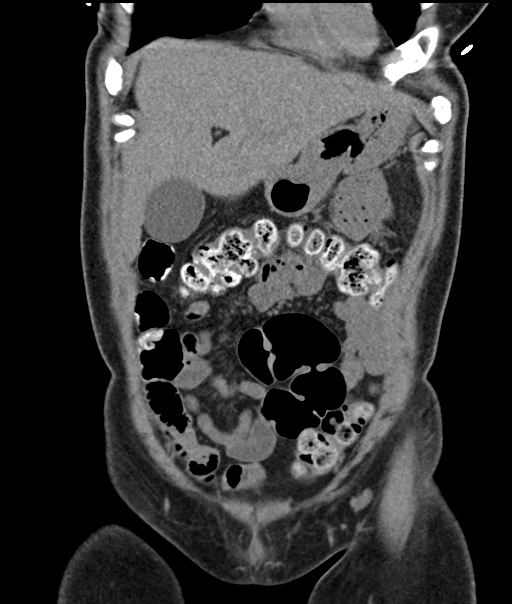
[im 65/146  soft-tissue]
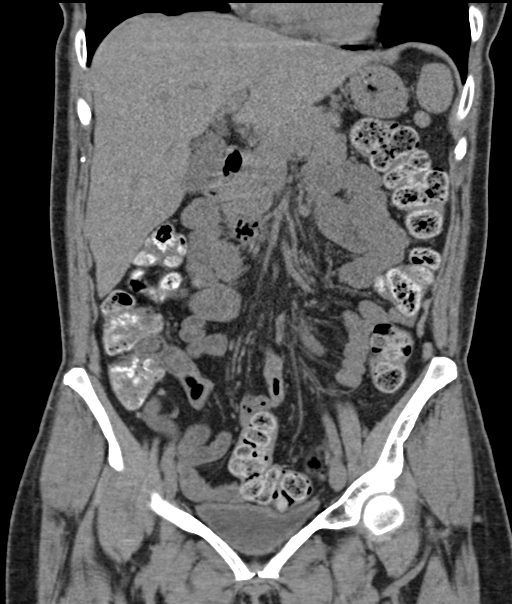
[im 81/146  soft-tissue]
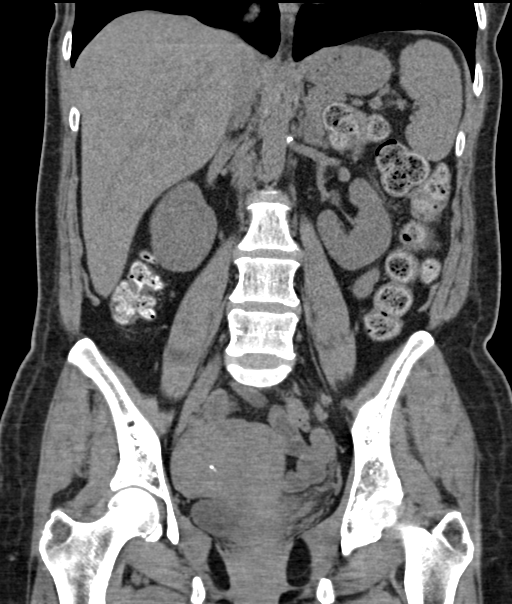

[16 of 46 positions shown; findings below may reference images not displayed]

FINDINGS: Lower chest: Lung bases are normal.

Hepatobiliary: Subcentimeter hypodensity over the right dome of the
liver unchanged likely a cyst or hemangioma. Gallbladder and biliary
tree are normal.

Pancreas: Normal.

Spleen: Normal.

Adrenals/Urinary Tract: Adrenal glands are normal. Kidneys are
normal in size without hydronephrosis or nephrolithiasis. Ureters
and bladder are normal.

Stomach/Bowel: Stomach and small bowel are within normal. Appendix
is normal. Colon is within normal.

Vascular/Lymphatic: Very minimal calcified plaque over the abdominal
aorta. No adenopathy.

Reproductive: Mildly enlarged uterus extending to the right of
midline with several masses compatible with leiomyomas. Ovaries
unremarkable.

Other: No free fluid or focal inflammatory change.

Musculoskeletal: Normal.
IMPRESSION: No acute findings in the abdomen/pelvis.

Mildly enlarged leiomyomatous uterus.

Very minimal aortic atherosclerosis ([id number]).

Stable subcentimeter hypodensity over the dome of the right lobe of
the liver likely a hemangioma or cyst.

## 2019-09-29 ENCOUNTER — Other Ambulatory Visit: Payer: Self-pay | Admitting: Physician Assistant

## 2019-09-29 NOTE — Telephone Encounter (Signed)
Next apt 08/23

## 2019-10-27 ENCOUNTER — Telehealth (INDEPENDENT_AMBULATORY_CARE_PROVIDER_SITE_OTHER): Payer: BC Managed Care – PPO | Admitting: Physician Assistant

## 2019-10-27 ENCOUNTER — Telehealth: Payer: Self-pay | Admitting: Physician Assistant

## 2019-10-27 ENCOUNTER — Encounter: Payer: Self-pay | Admitting: Physician Assistant

## 2019-10-27 DIAGNOSIS — F411 Generalized anxiety disorder: Secondary | ICD-10-CM | POA: Diagnosis not present

## 2019-10-27 DIAGNOSIS — G4700 Insomnia, unspecified: Secondary | ICD-10-CM

## 2019-10-27 DIAGNOSIS — F3341 Major depressive disorder, recurrent, in partial remission: Secondary | ICD-10-CM | POA: Diagnosis not present

## 2019-10-27 MED ORDER — ALPRAZOLAM 0.25 MG PO TABS: 0.2500 mg | ORAL_TABLET | Freq: Every evening | ORAL | 5 refills | Status: DC | PRN

## 2019-10-27 MED ORDER — LAMOTRIGINE 150 MG PO TABS: 150.0000 mg | ORAL_TABLET | Freq: Every day | ORAL | 5 refills | Status: DC

## 2019-10-27 MED ORDER — DULOXETINE HCL 60 MG PO CPEP: 60.0000 mg | ORAL_CAPSULE | Freq: Every day | ORAL | 5 refills | Status: DC

## 2019-10-27 MED ORDER — DULOXETINE HCL 30 MG PO CPEP: 30.0000 mg | ORAL_CAPSULE | Freq: Every day | ORAL | 5 refills | Status: DC

## 2019-10-27 MED ORDER — ZOLPIDEM TARTRATE 10 MG PO TABS
ORAL_TABLET | ORAL | 5 refills | Status: DC
Start: 2019-10-27 — End: 2020-03-15

## 2019-10-27 NOTE — Telephone Encounter (Signed)
Ms. tena, linebaugh are scheduled for a virtual visit with your provider today.    Just as we do with appointments in the office, we must obtain your consent to participate.  Your consent will be active for this visit and any virtual visit you may have with one of our providers in the next 365 days.    If you have a MyChart account, I can also send a copy of this consent to you electronically.  All virtual visits are billed to your insurance company just like a traditional visit in the office.  As this is a virtual visit, video technology does not allow for your provider to perform a traditional examination.  This may limit your provider's ability to fully assess your condition.  If your provider identifies any concerns that need to be evaluated in person or the need to arrange testing such as labs, EKG, etc, we will make arrangements to do so.    Although advances in technology are sophisticated, we cannot ensure that it will always work on either your end or our end.  If the connection with a video visit is poor, we may have to switch to a telephone visit.  With either a video or telephone visit, we are not always able to ensure that we have a secure connection.   I need to obtain your verbal consent now.   Are you willing to proceed with your visit today?   Angelys Yetman has provided verbal consent on 10/27/2019 for a virtual visit (video or telephone).   Melony Overly, PA-C 10/27/2019  3:01 PM

## 2019-10-27 NOTE — Progress Notes (Signed)
[facility]  Patient ID: Antoine Fiallos,  MRN: [id number]  PCP: Georgianne Fick, MD  Date of Evaluation: 10/27/2019 Time spent:20 minutes  Chief Complaint:  Chief Complaint    Anxiety; Depression; Insomnia     Virtual Visit via Telehealth  I connected with patient by a video enabled telemedicine application with their informed consent, and verified patient privacy and that I am speaking with the correct person using two identifiers.  I am private, in my office and the patient is at her parents home in [place]  I discussed the limitations, risks, security and privacy concerns of performing an evaluation and management service by video and the availability of in person appointments. I also discussed with the patient that there may be a patient responsible charge related to this service. The patient expressed understanding and agreed to proceed.   I discussed the assessment and treatment plan with the patient. The patient was provided an opportunity to ask questions and all were answered. The patient agreed with the plan and demonstrated an understanding of the instructions.   The patient was advised to call back or seek an in-person evaluation if the symptoms worsen or if the condition fails to improve as anticipated.  I provided 20 minutes of non-face-to-face time during this encounter.  HISTORY/CURRENT STATUS: HPI for routine med check.  States she is doing well.  She is visiting her parents in [place] for the past month and will plan to stay another couple of weeks.  It is good for her to get to be with them.  She is able to enjoy things.  Energy and motivation are good.  She is not crying easily.  Not isolating.  No changes in memory or concentration.  She sleeps well with the Ambien.  No suicidal or homicidal thoughts.  Anxiety is well controlled with her current medications.  She is usually only taking 1 Xanax in the evening to help her thoughts slow  down so that she can go to sleep.  Denies increased energy with decreased need for sleep.  No increased irritability, no increased spending or libido, no impulsivity or risky behavior.  No hallucinations.  Denies syncope, seizures, numbness, tingling, tremor, tics, unsteady gait, slurred speech, confusion.  No dystonia.  Individual Medical History/ Review of Systems: Changes? :No    Past medications for mental health diagnoses include: Paxil, Wellbutrin, Cymbalta, Xanax, Depakote, Lamictal, Tegretol, Prozac, Zoloft, Lexapro, Luvox, Viibryd, Ambien  Allergies: Patient has no known allergies.  Current Medications:  Current Outpatient Medications:  .  ALPRAZolam (XANAX) 0.25 MG tablet, Take 1 tablet (0.25 mg total) by mouth at bedtime as needed for anxiety., Disp: 30 tablet, Rfl: 5 .  calcium citrate-vitamin D (CITRACAL+D) 315-200 MG-UNIT per tablet, Take 1 tablet by mouth 2 (two) times daily., Disp: , Rfl:  .  DULoxetine (CYMBALTA) 30 MG capsule, Take 1 capsule (30 mg total) by mouth daily. Take w/ 60 mg=90 mg, Disp: 30 capsule, Rfl: 5 .  DULoxetine (CYMBALTA) 60 MG capsule, Take 1 capsule (60 mg total) by mouth daily. Take w/ the 30 mg=90mg , Disp: 30 capsule, Rfl: 5 .  glucosamine-chondroitin 500-400 MG tablet, Take 1 tablet by mouth 3 (three) times daily., Disp: , Rfl:  .  ibuprofen (ADVIL,MOTRIN) 200 MG tablet, Take 600 mg by mouth every 6 (six) hours as needed for moderate pain., Disp: , Rfl:  .  lamoTRIgine (LAMICTAL) 150 MG tablet, Take 1 tablet (150 mg total) by mouth at bedtime., Disp: 30 tablet, Rfl:  5 .  magnesium 30 MG tablet, Take 30 mg by mouth 2 (two) times daily., Disp: , Rfl:  .  Melatonin 3-10 MG TABS, Take 5-10 mg by mouth at bedtime as needed., Disp: 60 tablet, Rfl: 0 .  meloxicam (MOBIC) 15 MG tablet, Take 15 mg by mouth daily., Disp: , Rfl:  .  MULTIPLE VITAMIN PO, Take 1 tablet by mouth daily., Disp: , Rfl:  .  Omega-3 Fatty Acids (FISH OIL) 1000 MG CAPS, Take 1 capsule  by mouth daily., Disp: , Rfl:  .  TURMERIC PO, Take by mouth., Disp: , Rfl:  .  verapamil (CALAN-SR) 240 MG CR tablet, Take 480 mg by mouth at bedtime., Disp: , Rfl: 3 .  zolpidem (AMBIEN) 10 MG tablet, TAKE ONE TABLET BY MOUTH EVERY NIGHT AT BEDTIME AS NEEDED FOR SLEEP, Disp: 30 tablet, Rfl: 5 .  acetaminophen (TYLENOL) 500 MG tablet, Take 1,000 mg by mouth every 6 (six) hours as needed for moderate pain or headache. (Patient not taking: Reported on 10/27/2019), Disp: , Rfl:  Medication Side Effects: dizziness/lightheadedness  Family Medical/ Social History: Changes? No  MENTAL HEALTH EXAM:  Last menstrual period 02/24/2016.There is no height or weight on file to calculate BMI.  General Appearance: Casual, Neat and Well Groomed  Eye Contact:  Good  Speech:  Clear and Coherent and Normal Rate  Volume:  Normal  Mood:  Euthymic  Affect:  Appropriate  Thought Process:  Goal Directed and Descriptions of Associations: Intact  Orientation:  Full (Time, Place, and Person)  Thought Content: Logical   Suicidal Thoughts:  No  Homicidal Thoughts:  No  Memory:  WNL  Judgement:  Good  Insight:  Good  Psychomotor Activity:  Normal  Concentration:  Concentration: Good  Recall:  Good  Fund of Knowledge: Good  Language: Good  Assets:  Desire for Improvement  ADL's:  Intact  Cognition: WNL  Prognosis:  Good    DIAGNOSES:    ICD-10-CM   1. Recurrent major depressive disorder, in partial remission (HCC)  F33.41   2. GAD (generalized anxiety disorder)  F41.1   3. Insomnia, unspecified type  G47.00     Receiving Psychotherapy: No    RECOMMENDATIONS:  PDMP was reviewed. I provided 20 minutes of nonface-to-face time during this encounter. I am glad to see her doing so well!   Continue Xanax 0.25 mg nightly as needed.   Continue Cymbalta, total of 90 mg daily. Continue Lamictal 150 mg nightly. Continue Ambien 10 mg nightly as needed.  Return in 6 months.  Melony Overly, PA-C

## 2020-03-13 ENCOUNTER — Other Ambulatory Visit: Payer: Self-pay | Admitting: Physician Assistant

## 2020-04-09 ENCOUNTER — Other Ambulatory Visit: Payer: Self-pay | Admitting: Physician Assistant

## 2020-04-11 ENCOUNTER — Other Ambulatory Visit: Payer: Self-pay | Admitting: Physician Assistant

## 2020-04-28 ENCOUNTER — Other Ambulatory Visit: Payer: Self-pay

## 2020-04-28 ENCOUNTER — Ambulatory Visit (INDEPENDENT_AMBULATORY_CARE_PROVIDER_SITE_OTHER): Payer: BC Managed Care – PPO | Admitting: Physician Assistant

## 2020-04-28 ENCOUNTER — Encounter: Payer: Self-pay | Admitting: Physician Assistant

## 2020-04-28 DIAGNOSIS — F3341 Major depressive disorder, recurrent, in partial remission: Secondary | ICD-10-CM | POA: Diagnosis not present

## 2020-04-28 DIAGNOSIS — G4700 Insomnia, unspecified: Secondary | ICD-10-CM | POA: Diagnosis not present

## 2020-04-28 DIAGNOSIS — F411 Generalized anxiety disorder: Secondary | ICD-10-CM | POA: Diagnosis not present

## 2020-04-28 MED ORDER — LAMOTRIGINE 150 MG PO TABS: 150.0000 mg | ORAL_TABLET | Freq: Every day | ORAL | 5 refills | Status: DC

## 2020-04-28 MED ORDER — ALPRAZOLAM 0.25 MG PO TABS: 0.2500 mg | ORAL_TABLET | Freq: Every evening | ORAL | 5 refills | Status: DC | PRN

## 2020-04-28 NOTE — Progress Notes (Signed)
[facility]  Patient ID: Valerie Kent,  MRN: [id number]  PCP: Georgianne Fick, MD  Date of Evaluation: 04/28/2020 Time spent:20 minutes  Chief Complaint:  Chief Complaint    Anxiety; Depression; Insomnia; Follow-up      HISTORY/CURRENT STATUS: HPI for routine med check.  Meds are working well. She had some family stress over the holidays, was in [place] with her parents, her sister who has bipolar disorder, her brother who probably has Asperger's syndrome and then her daughter.  Just typical family issues.  Patient denies loss of interest in usual activities and is able to enjoy things.  Denies decreased energy or motivation.  Appetite has not changed.  No extreme sadness, tearfulness, or feelings of hopelessness.  Denies any changes in concentration, making decisions or remembering things.  Denies suicidal or homicidal thoughts.  Anxiety is well controlled.  Sleeps well with Ambien.  Patient denies increased energy with decreased need for sleep, no increased talkativeness, no racing thoughts, no impulsivity or risky behaviors, no increased spending, no increased libido, no grandiosity, no increased irritability or anger, and no hallucinations.  Denies syncope, seizures, numbness, tingling, tremor, tics, unsteady gait, slurred speech, confusion.  No dystonia.  Individual Medical History/ Review of Systems: Changes? :No    Past medications for mental health diagnoses include: Paxil, Wellbutrin, Cymbalta at higher dose caused dizziness, Xanax, Depakote, Lamictal, Tegretol, Prozac, Zoloft, Lexapro, Luvox, Viibryd, Ambien  Allergies: Patient has no known allergies.  Current Medications:  Current Outpatient Medications:  .  acetaminophen (TYLENOL) 500 MG tablet, Take 1,000 mg by mouth every 6 (six) hours as needed for moderate pain or headache., Disp: , Rfl:  .  calcium citrate-vitamin D (CITRACAL+D) 315-200 MG-UNIT per tablet, Take 1 tablet by mouth 2 (two)  times daily., Disp: , Rfl:  .  DULoxetine (CYMBALTA) 30 MG capsule, TAKE 1 CAPSULE (30 MG TOTAL) BY MOUTH DAILY. TAKE W/ 60 MG=90 MG, Disp: 90 capsule, Rfl: 2 .  DULoxetine (CYMBALTA) 60 MG capsule, TAKE 1 CAPSULE (60 MG TOTAL) BY MOUTH DAILY. TAKE W/ THE 30 MG=90MG , Disp: 60 capsule, Rfl: 5 .  glucosamine-chondroitin 500-400 MG tablet, Take 1 tablet by mouth 3 (three) times daily., Disp: , Rfl:  .  ibuprofen (ADVIL,MOTRIN) 200 MG tablet, Take 600 mg by mouth every 6 (six) hours as needed for moderate pain., Disp: , Rfl:  .  magnesium 30 MG tablet, Take 30 mg by mouth 2 (two) times daily., Disp: , Rfl:  .  Melatonin 3-10 MG TABS, Take 5-10 mg by mouth at bedtime as needed., Disp: 60 tablet, Rfl: 0 .  meloxicam (MOBIC) 15 MG tablet, Take 15 mg by mouth daily., Disp: , Rfl:  .  MULTIPLE VITAMIN PO, Take 1 tablet by mouth daily., Disp: , Rfl:  .  Omega-3 Fatty Acids (FISH OIL) 1000 MG CAPS, Take 1 capsule by mouth daily., Disp: , Rfl:  .  TURMERIC PO, Take by mouth., Disp: , Rfl:  .  verapamil (CALAN-SR) 240 MG CR tablet, Take 480 mg by mouth at bedtime., Disp: , Rfl: 3 .  zolpidem (AMBIEN) 10 MG tablet, TAKE ONE TABLET BY MOUTH EVERY NIGHT AT BEDTIME AS NEEDED FOR SLEEP, Disp: 30 tablet, Rfl: 5 .  ALPRAZolam (XANAX) 0.25 MG tablet, Take 1 tablet (0.25 mg total) by mouth at bedtime as needed for anxiety., Disp: 30 tablet, Rfl: 5 .  lamoTRIgine (LAMICTAL) 150 MG tablet, Take 1 tablet (150 mg total) by mouth at bedtime., Disp: 30 tablet, Rfl: 5 Medication  Side Effects: none  Family Medical/ Social History: Changes? No  MENTAL HEALTH EXAM:  Last menstrual period 02/24/2016.There is no height or weight on file to calculate BMI.  General Appearance: Casual, Neat and Well Groomed  Eye Contact:  Good  Speech:  Clear and Coherent and Normal Rate  Volume:  Normal  Mood:  Euthymic  Affect:  Appropriate  Thought Process:  Goal Directed and Descriptions of Associations: Intact  Orientation:  Full  (Time, Place, and Person)  Thought Content: Logical   Suicidal Thoughts:  No  Homicidal Thoughts:  No  Memory:  WNL  Judgement:  Good  Insight:  Good  Psychomotor Activity:  Normal  Concentration:  Concentration: Good and Attention Span: Good  Recall:  Good  Fund of Knowledge: Good  Language: Good  Assets:  Desire for Improvement  ADL's:  Intact  Cognition: WNL  Prognosis:  Good    DIAGNOSES:    ICD-10-CM   1. Recurrent major depressive disorder, in partial remission (HCC)  F33.41   2. GAD (generalized anxiety disorder)  F41.1   3. Insomnia, unspecified type  G47.00     Receiving Psychotherapy: No    RECOMMENDATIONS:  PDMP was reviewed. I provided 20 minutes of face-to-face time during this encounter in which we discussed her response to medications.  She is doing well with current doses so no changes will be made.  Continue Xanax 0.25 mg nightly as needed.  (She gets at [facility], due to cost) Continue Cymbalta, total of 90 mg daily. Continue Lamictal 150 mg nightly. Continue Ambien 10 mg nightly as needed.  Return in 6 months.  Melony Overly, PA-C

## 2020-05-25 ENCOUNTER — Telehealth: Payer: Self-pay | Admitting: Physician Assistant

## 2020-05-25 NOTE — Telephone Encounter (Signed)
Received Jury duty summons 3/22. Placed in box.

## 2020-05-26 NOTE — Telephone Encounter (Signed)
Letter to be done. Dx GAD. Summons was placed in admin's box up front.

## 2020-05-27 DIAGNOSIS — Z0289 Encounter for other administrative examinations: Secondary | ICD-10-CM

## 2020-05-27 NOTE — Telephone Encounter (Signed)
Letter was signed and placed in admin's box.

## 2020-09-15 ENCOUNTER — Other Ambulatory Visit: Payer: Self-pay | Admitting: Physician Assistant

## 2020-09-16 NOTE — Telephone Encounter (Signed)
Please send if appropriate.

## 2020-10-17 ENCOUNTER — Other Ambulatory Visit: Payer: Self-pay | Admitting: Physician Assistant

## 2020-10-20 ENCOUNTER — Other Ambulatory Visit: Payer: Self-pay | Admitting: Physician Assistant

## 2020-10-21 NOTE — Telephone Encounter (Signed)
Last filled 7/14

## 2020-10-27 ENCOUNTER — Other Ambulatory Visit: Payer: Self-pay

## 2020-10-27 ENCOUNTER — Ambulatory Visit (INDEPENDENT_AMBULATORY_CARE_PROVIDER_SITE_OTHER): Payer: BC Managed Care – PPO | Admitting: Physician Assistant

## 2020-10-27 ENCOUNTER — Encounter: Payer: Self-pay | Admitting: Physician Assistant

## 2020-10-27 DIAGNOSIS — F411 Generalized anxiety disorder: Secondary | ICD-10-CM | POA: Diagnosis not present

## 2020-10-27 DIAGNOSIS — G4700 Insomnia, unspecified: Secondary | ICD-10-CM | POA: Diagnosis not present

## 2020-10-27 DIAGNOSIS — F3341 Major depressive disorder, recurrent, in partial remission: Secondary | ICD-10-CM

## 2020-10-27 MED ORDER — DULOXETINE HCL 60 MG PO CPEP: 60.0000 mg | ORAL_CAPSULE | Freq: Two times a day (BID) | ORAL | 5 refills | Status: DC

## 2020-10-27 MED ORDER — LAMOTRIGINE 150 MG PO TABS: 150.0000 mg | ORAL_TABLET | Freq: Every day | ORAL | 5 refills | Status: DC

## 2020-10-27 MED ORDER — ALPRAZOLAM 0.25 MG PO TABS: 0.2500 mg | ORAL_TABLET | Freq: Every evening | ORAL | 5 refills | Status: DC | PRN

## 2020-10-27 NOTE — Progress Notes (Signed)
[facility]  Patient ID: Valerie Kent,  MRN: [id number]  PCP: Georgianne Fick, MD  Date of Evaluation: 10/27/2020. Time spent:20 minutes  Chief Complaint:  Chief Complaint   Anxiety; Depression; Insomnia; Follow-up     HISTORY/CURRENT STATUS: HPI for routine med check.  Here for 90-month med check for anxiety, insomnia, and mild depression.  She states she is doing very well as far as the anxiety and sleep goes but she does feel less like doing things she normally likes to do and her energy and motivation have decreased some in the past several months.  No panic attacks.  Appetite is normal and weight is stable. . Denies SI/HI.  Patient denies increased energy with decreased need for sleep, no increased talkativeness, no racing thoughts, no impulsivity or risky behaviors, no increased spending, no increased libido, no grandiosity, no increased irritability or anger, and no hallucinations.  Denies syncope, seizures, numbness, tingling, tremor, tics, unsteady gait, slurred speech, confusion.  No dystonia.  Individual Medical History/ Review of Systems: Changes? :No    Past medications for mental health diagnoses include: Paxil, Wellbutrin, Cymbalta at higher dose caused dizziness, Xanax, Depakote, Lamictal, Tegretol, Prozac, Zoloft, Lexapro, Luvox, Viibryd, Ambien  Allergies: Patient has no known allergies.  Current Medications:  Current Outpatient Medications:    acetaminophen (TYLENOL) 500 MG tablet, Take 1,000 mg by mouth every 6 (six) hours as needed for moderate pain or headache., Disp: , Rfl:    calcium citrate-vitamin D (CITRACAL+D) 315-200 MG-UNIT per tablet, Take 1 tablet by mouth 2 (two) times daily., Disp: , Rfl:    glucosamine-chondroitin 500-400 MG tablet, Take 1 tablet by mouth 3 (three) times daily., Disp: , Rfl:    ibuprofen (ADVIL,MOTRIN) 200 MG tablet, Take 600 mg by mouth every 6 (six) hours as needed for moderate pain., Disp: , Rfl:     magnesium 30 MG tablet, Take 30 mg by mouth 2 (two) times daily., Disp: , Rfl:    Melatonin 3-10 MG TABS, Take 5-10 mg by mouth at bedtime as needed., Disp: 60 tablet, Rfl: 0   meloxicam (MOBIC) 15 MG tablet, Take 15 mg by mouth daily., Disp: , Rfl:    MULTIPLE VITAMIN PO, Take 1 tablet by mouth daily., Disp: , Rfl:    Omega-3 Fatty Acids (FISH OIL) 1000 MG CAPS, Take 1 capsule by mouth daily., Disp: , Rfl:    TURMERIC PO, Take by mouth., Disp: , Rfl:    verapamil (CALAN-SR) 240 MG CR tablet, Take 480 mg by mouth at bedtime., Disp: , Rfl: 3   zolpidem (AMBIEN) 10 MG tablet, TAKE ONE TABLET BY MOUTH EVERY NIGHT AT BEDTIME AS NEEDED FOR SLEEP, Disp: 30 tablet, Rfl: 5   ALPRAZolam (XANAX) 0.25 MG tablet, Take 1 tablet (0.25 mg total) by mouth at bedtime as needed for anxiety., Disp: 30 tablet, Rfl: 5   DULoxetine (CYMBALTA) 60 MG capsule, Take 1 capsule (60 mg total) by mouth 2 (two) times daily., Disp: 60 capsule, Rfl: 5   lamoTRIgine (LAMICTAL) 150 MG tablet, Take 1 tablet (150 mg total) by mouth at bedtime., Disp: 30 tablet, Rfl: 5 Medication Side Effects: none  Family Medical/ Social History: Changes? No  MENTAL HEALTH EXAM:  Last menstrual period 02/24/2016.There is no height or weight on file to calculate BMI.  General Appearance: Casual, Neat and Well Groomed  Eye Contact:  Good  Speech:  Clear and Coherent and Normal Rate  Volume:  Normal  Mood:  Euthymic  Affect:  Appropriate  Thought  Process:  Goal Directed and Descriptions of Associations: Intact  Orientation:  Full (Time, Place, and Person)  Thought Content: Logical   Suicidal Thoughts:  No  Homicidal Thoughts:  No  Memory:  WNL  Judgement:  Good  Insight:  Good  Psychomotor Activity:  Normal  Concentration:  Concentration: Good and Attention Span: Good  Recall:  Good  Fund of Knowledge: Good  Language: Good  Assets:  Desire for Improvement  ADL's:  Intact  Cognition: WNL  Prognosis:  Good    DIAGNOSES:     ICD-10-CM   1. Recurrent major depressive disorder, in partial remission (HCC)  F33.41     2. GAD (generalized anxiety disorder)  F41.1     3. Insomnia, unspecified type  G47.00        Receiving Psychotherapy: No    RECOMMENDATIONS:  PDMP was reviewed. Last Ambien 10/21/2020, Xanax on 04/28/2020 I provided 20 minutes of face to face time during this encounter, including time spent before and after the visit in records review, medical decision making, and charting.  She does have some mild depression not as well treated so increase Cymbalta. Continue Xanax 0.25 mg nightly as needed.  (She gets at [facility], due to cost) Increase Cymbalta to 60 mg, 1 po bid. if she is having side effects that will not go away after a week or so, call and I will send in a 30 mg pill and she will go back down to a total of 90 mg daily. Continue Lamictal 150 mg nightly. Continue Ambien 10 mg nightly as needed.  Return in 6 months.  Melony Overly, PA-C

## 2020-10-31 ENCOUNTER — Encounter: Payer: Self-pay | Admitting: Physician Assistant

## 2021-02-28 ENCOUNTER — Other Ambulatory Visit: Payer: Self-pay | Admitting: Physician Assistant

## 2021-04-20 ENCOUNTER — Other Ambulatory Visit: Payer: Self-pay | Admitting: Physician Assistant

## 2021-04-28 ENCOUNTER — Other Ambulatory Visit: Payer: Self-pay

## 2021-04-28 ENCOUNTER — Ambulatory Visit (INDEPENDENT_AMBULATORY_CARE_PROVIDER_SITE_OTHER): Payer: BC Managed Care – PPO | Admitting: Physician Assistant

## 2021-04-28 ENCOUNTER — Encounter: Payer: Self-pay | Admitting: Physician Assistant

## 2021-04-28 DIAGNOSIS — F3341 Major depressive disorder, recurrent, in partial remission: Secondary | ICD-10-CM

## 2021-04-28 DIAGNOSIS — G4700 Insomnia, unspecified: Secondary | ICD-10-CM

## 2021-04-28 DIAGNOSIS — F411 Generalized anxiety disorder: Secondary | ICD-10-CM

## 2021-04-28 MED ORDER — ZOLPIDEM TARTRATE 10 MG PO TABS: 10.0000 mg | ORAL_TABLET | Freq: Every evening | ORAL | 5 refills | Status: DC | PRN

## 2021-04-28 MED ORDER — DULOXETINE HCL 30 MG PO CPEP: 90.0000 mg | ORAL_CAPSULE | Freq: Every day | ORAL | 1 refills | Status: DC

## 2021-04-28 MED ORDER — ALPRAZOLAM 0.25 MG PO TABS: 0.2500 mg | ORAL_TABLET | Freq: Every evening | ORAL | 5 refills | Status: DC | PRN

## 2021-04-28 MED ORDER — LAMOTRIGINE 150 MG PO TABS: 150.0000 mg | ORAL_TABLET | Freq: Every day | ORAL | 5 refills | Status: DC

## 2021-04-28 NOTE — Progress Notes (Signed)
[facility]  Patient ID: Valerie Kent,  MRN: [id number]  PCP: Merrilee Seashore, MD  Date of Evaluation: 04/28/2021 Time spent:20 minutes  Chief Complaint:  Chief Complaint   Anxiety; Depression; Insomnia; Follow-up     HISTORY/CURRENT STATUS: HPI for routine 6 month med check.  She is under a lot of stress right now.  Her oldest daughter is getting married in [place] next month.  Patient has needed the Xanax a few times recently.  She hardly ever takes it, maybe once or twice a month.  Lately though she seems on edge a lot, and her daughter is unhappy with how she is handling things, even though she wanted patient to coordinate everything.  States her left upper eyelid and forehead will sometimes twitch.  She has had this problem in the past when she was really anxious.    At the last visit 6 months ago we increased the dose of Cymbalta.  She was having decreased energy and motivation and felt mildly depressed.  She states she is not depressed now just anxious.  But she has had some dizziness off and on, which happened to her another time that she tried taking Cymbalta 120 mg daily, even with 60 mg twice daily.  Not sure if this is the right dose for her or even the right medication.  ADLs and personal hygiene are normal.  She is not isolating.  No suicidal or homicidal thoughts.  She sleeps well but has to take the Ambien every night.  She cannot sleep without it but is a very light sleeper, and feels as if she has not slept at all the next morning.  Patient denies increased energy with decreased need for sleep, no increased talkativeness, no racing thoughts, no impulsivity or risky behaviors, no increased spending, no increased libido, no grandiosity, no increased irritability or anger, and no hallucinations.  Complains of waking up in the night hungry and will get up and eat.  This has been an issue for her for years but she is just now mentioning it.  Cannot really  say that it is worse since being on any of these medications or the increase in dose of Cymbalta last August.  It is not that she is eating and does not remember it.  She knows when she gets up and fixes food.  Denies sleepwalking, talking in her sleep, sleep driving or any other issues that may be caused by the Ambien.  No weight gain is reported.  Denies syncope, seizures, numbness, tingling, tremor, tics, unsteady gait, slurred speech, confusion.  No dystonia.  Individual Medical History/ Review of Systems: Changes? :No    Past medications for mental health diagnoses include: Paxil, Wellbutrin, Cymbalta at higher dose caused dizziness, Xanax, Depakote, Lamictal, Tegretol, Prozac, Zoloft, Lexapro, Luvox, Viibryd, Ambien  Allergies: Patient has no known allergies.  Current Medications:  Current Outpatient Medications:    acetaminophen (TYLENOL) 500 MG tablet, Take 1,000 mg by mouth every 6 (six) hours as needed for moderate pain or headache., Disp: , Rfl:    calcium citrate-vitamin D (CITRACAL+D) 315-200 MG-UNIT per tablet, Take 1 tablet by mouth 2 (two) times daily., Disp: , Rfl:    DULoxetine (CYMBALTA) 30 MG capsule, Take 3 capsules (90 mg total) by mouth daily., Disp: 90 capsule, Rfl: 1   glucosamine-chondroitin 500-400 MG tablet, Take 1 tablet by mouth 3 (three) times daily., Disp: , Rfl:    ibuprofen (ADVIL,MOTRIN) 200 MG tablet, Take 600 mg by mouth every 6 (  six) hours as needed for moderate pain., Disp: , Rfl:    magnesium 30 MG tablet, Take 30 mg by mouth 2 (two) times daily., Disp: , Rfl:    Melatonin 3-10 MG TABS, Take 5-10 mg by mouth at bedtime as needed., Disp: 60 tablet, Rfl: 0   meloxicam (MOBIC) 15 MG tablet, Take 15 mg by mouth daily., Disp: , Rfl:    MULTIPLE VITAMIN PO, Take 1 tablet by mouth daily., Disp: , Rfl:    Omega-3 Fatty Acids (FISH OIL) 1000 MG CAPS, Take 1 capsule by mouth daily., Disp: , Rfl:    TURMERIC PO, Take by mouth., Disp: , Rfl:    verapamil (CALAN-SR)  240 MG CR tablet, Take 480 mg by mouth at bedtime., Disp: , Rfl: 3   ALPRAZolam (XANAX) 0.25 MG tablet, Take 1 tablet (0.25 mg total) by mouth at bedtime as needed for anxiety., Disp: 30 tablet, Rfl: 5   lamoTRIgine (LAMICTAL) 150 MG tablet, Take 1 tablet (150 mg total) by mouth at bedtime., Disp: 30 tablet, Rfl: 5   zolpidem (AMBIEN) 10 MG tablet, Take 1 tablet (10 mg total) by mouth at bedtime as needed for sleep., Disp: 30 tablet, Rfl: 5 Medication Side Effects: none  Family Medical/ Social History: Changes? No  MENTAL HEALTH EXAM:  Last menstrual period 02/24/2016.There is no height or weight on file to calculate BMI.  General Appearance: Casual, Neat and Well Groomed  Eye Contact:  Good  Speech:  Clear and Coherent and Normal Rate  Volume:  Normal  Mood:  Anxious  Affect:  Anxious  Thought Process:  Goal Directed and Descriptions of Associations: Intact  Orientation:  Full (Time, Place, and Person)  Thought Content: Logical   Suicidal Thoughts:  No  Homicidal Thoughts:  No  Memory:  WNL  Judgement:  Good  Insight:  Good  Psychomotor Activity:  Normal  Concentration:  Concentration: Good and Attention Span: Good  Recall:  Good  Fund of Knowledge: Good  Language: Good  Assets:  Desire for Improvement  ADL's:  Intact  Cognition: WNL  Prognosis:  Good    DIAGNOSES:    ICD-10-CM   1. GAD (generalized anxiety disorder)  F41.1     2. Recurrent major depressive disorder, in partial remission ([facility])  F33.41     3. Insomnia, unspecified type  G47.00         Receiving Psychotherapy: No    RECOMMENDATIONS:  PDMP was reviewed. Last Ambien 04/21/2021, Xanax on 10/27/2020.  I provided 20 minutes of face to face time during this encounter, including time spent before and after the visit in records review, medical decision making, counseling pertinent to today's visit, and charting.  We discussed the different problems that she is having. Waking up and eating in the middle of  the night does not seem to be a new problem and likely unrelated to the medication.  She admits to not eating full meals, but rather many small frequent meals throughout the day so it could just be that she is hungry when she goes to bed. Discussed the anxiety.  This seems very situational, and hopefully after her daughter's wedding the anxiety will let up.  I have encouraged her to take the Xanax more often when needed.  At the present time she is only taking it once or twice a month. The tic that she reports is not visible at this time, but it can be because of anxiety therefore the Xanax may be helpful. We discussed  decreasing the Cymbalta or even changing to a different antidepressant.  I do not think it is marked to make a major change right now with 1 month before her daughter's wedding.  She understands and agrees.  Since she is having some dizziness that could be related to the higher dose of Cymbalta, I recommend decreasing back to 90 mg.  She will call if she has any problems with that decrease. Recommend that she come in sooner than 6 months, and we will assess symptoms at that time.  If needed, then we will make major medication changes.  Continue Xanax 0.25 mg nightly as needed.  (She gets at [facility], due to cost) Decrease Cymbalta 30 mg, 3 qd. Continue Lamictal 150 mg nightly. Continue Ambien 10 mg nightly as needed.  Return in 6 to 8 weeks.  Donnal Moat, PA-C

## 2021-05-16 ENCOUNTER — Other Ambulatory Visit: Payer: Self-pay | Admitting: Physician Assistant

## 2021-05-24 ENCOUNTER — Other Ambulatory Visit: Payer: Self-pay | Admitting: Physician Assistant

## 2021-06-21 ENCOUNTER — Other Ambulatory Visit: Payer: Self-pay | Admitting: Physician Assistant

## 2021-06-28 ENCOUNTER — Encounter: Payer: Self-pay | Admitting: Physician Assistant

## 2021-06-28 ENCOUNTER — Ambulatory Visit (INDEPENDENT_AMBULATORY_CARE_PROVIDER_SITE_OTHER): Payer: 59 | Admitting: Physician Assistant

## 2021-06-28 DIAGNOSIS — F411 Generalized anxiety disorder: Secondary | ICD-10-CM | POA: Diagnosis not present

## 2021-06-28 DIAGNOSIS — F3341 Major depressive disorder, recurrent, in partial remission: Secondary | ICD-10-CM

## 2021-06-28 DIAGNOSIS — G4700 Insomnia, unspecified: Secondary | ICD-10-CM | POA: Diagnosis not present

## 2021-06-28 NOTE — Progress Notes (Signed)
[facility] ? ?Patient ID: Valerie Kent,  ?MRN: [id number] ? ?PCP: Georgianne Fick, MD ? ?Date of Evaluation: 06/28/2021 ?Time spent:30 minutes ? ?Chief Complaint:  ?Chief Complaint   ?Anxiety; Depression; Insomnia; Follow-up ?  ? ? ?HISTORY/CURRENT STATUS: ?HPI for routine med check.  ? ?We decreased the Cymbalta 2 months ago. Feels much better being on the 90 mg. Patient denies loss of interest in usual activities and is able to enjoy things.  Her daughter got married last month in [place]  She shows me pictures of the wedding which are great.  Denies decreased energy or motivation.  Appetite has not changed.  No extreme sadness, tearfulness, or feelings of hopelessness.  ADLs and personal hygiene are normal.  She sleeps good as long as she takes the Ambien.  Denies suicidal or homicidal thoughts. ? ?She asked about being tested for ADD or ADHD.  Her daughter is a Theatre manager and has told her mom that she bounces around from 1 thing to another a lot and recommended she get tested. ? ?Still has anxiety at times, it hits just about every day between 2 and 3:00.  If it is really bad she will take a Xanax but tries not to.  She does feel an overwhelming sense of uneasiness.  Not having palpitations, shortness of breath, tachycardia, or sweating. ? ?Patient denies increased energy with decreased need for sleep, no increased talkativeness, no racing thoughts, no impulsivity or risky behaviors, no increased spending, no increased libido, no grandiosity, no increased irritability or anger, and no hallucinations. ? ?Denies dizziness, syncope, seizures, numbness, tingling, tremor, tics, unsteady gait, slurred speech, confusion. Denies muscle or joint pain, stiffness, or dystonia. ? ?Individual Medical History/ Review of Systems: Changes? :No   ? ?Past medications for mental health diagnoses include: ?Paxil, Wellbutrin, Cymbalta at higher dose caused dizziness, Xanax, Depakote, Lamictal,  Tegretol, Prozac, Zoloft, Lexapro, Luvox, Viibryd, Ambien ? ?Allergies: Patient has no known allergies. ? ?Current Medications:  ?Current Outpatient Medications:  ?  acetaminophen (TYLENOL) 500 MG tablet, Take 1,000 mg by mouth every 6 (six) hours as needed for moderate pain or headache., Disp: , Rfl:  ?  ALPRAZolam (XANAX) 0.25 MG tablet, Take 1 tablet (0.25 mg total) by mouth at bedtime as needed for anxiety., Disp: 30 tablet, Rfl: 5 ?  calcium citrate-vitamin D (CITRACAL+D) 315-200 MG-UNIT per tablet, Take 1 tablet by mouth 2 (two) times daily., Disp: , Rfl:  ?  DULoxetine (CYMBALTA) 30 MG capsule, TAKE 3 CAPSULES BY MOUTH EVERY DAY, Disp: 270 capsule, Rfl: 0 ?  glucosamine-chondroitin 500-400 MG tablet, Take 1 tablet by mouth 3 (three) times daily., Disp: , Rfl:  ?  ibuprofen (ADVIL,MOTRIN) 200 MG tablet, Take 600 mg by mouth every 6 (six) hours as needed for moderate pain., Disp: , Rfl:  ?  lamoTRIgine (LAMICTAL) 150 MG tablet, Take 1 tablet (150 mg total) by mouth at bedtime., Disp: 30 tablet, Rfl: 5 ?  magnesium 30 MG tablet, Take 30 mg by mouth 2 (two) times daily., Disp: , Rfl:  ?  Melatonin 3-10 MG TABS, Take 5-10 mg by mouth at bedtime as needed., Disp: 60 tablet, Rfl: 0 ?  meloxicam (MOBIC) 15 MG tablet, Take 15 mg by mouth daily., Disp: , Rfl:  ?  MULTIPLE VITAMIN PO, Take 1 tablet by mouth daily., Disp: , Rfl:  ?  Omega-3 Fatty Acids (FISH OIL) 1000 MG CAPS, Take 1 capsule by mouth daily., Disp: , Rfl:  ?  TURMERIC PO,  Take by mouth., Disp: , Rfl:  ?  verapamil (CALAN-SR) 240 MG CR tablet, Take 480 mg by mouth at bedtime., Disp: , Rfl: 3 ?  zolpidem (AMBIEN) 10 MG tablet, Take 1 tablet (10 mg total) by mouth at bedtime as needed for sleep., Disp: 30 tablet, Rfl: 5 ?Medication Side Effects: none ? ?Family Medical/ Social History: Changes? No ? ?MENTAL HEALTH EXAM: ? ?Last menstrual period 02/24/2016.There is no height or weight on file to calculate BMI.  ?General Appearance: Casual, Neat and Well  Groomed  ?Eye Contact:  Good  ?Speech:  Clear and Coherent and Normal Rate  ?Volume:  Normal  ?Mood:  Euthymic  ?Affect:  Congruent  ?Thought Process:  Goal Directed and Descriptions of Associations: Intact  ?Orientation:  Full (Time, Place, and Person)  ?Thought Content: Logical   ?Suicidal Thoughts:  No  ?Homicidal Thoughts:  No  ?Memory:  WNL  ?Judgement:  Good  ?Insight:  Good  ?Psychomotor Activity:  Normal  ?Concentration:  Concentration: Good and Attention Span: Good  ?Recall:  Good  ?Fund of Knowledge: Good  ?Language: Good  ?Assets:  Desire for Improvement  ?ADL's:  Intact  ?Cognition: WNL  ?Prognosis:  Good  ? ? ?DIAGNOSES:  ?  ICD-10-CM   ?1. GAD (generalized anxiety disorder)  F41.1   ?  ?2. Recurrent major depressive disorder, in partial remission (HCC)  F33.41   ?  ?3. Insomnia, unspecified type  G47.00   ?  ? ? ?Receiving Psychotherapy: No  ? ? ?RECOMMENDATIONS:  ?PDMP was reviewed. Last Ambien filled 05/31/2021.  Last Xanax filled 04/28/2021. ?I provided 30 minutes of face to face time during this encounter, including time spent before and after the visit in records review, medical decision making, counseling pertinent to today's visit, and charting.  ?Recommend she contact [place] attention specialist for testing for ADHD.  I did let her know that typically I try to avoid stimulants in adults, and especially if she is having to take the Xanax daily which she is not.  But there are other treatments such as Strattera, Marilynn Latino, [place], [place].  But the first step is to be tested. ?For the afternoon anxiety I recommend taking the Xanax 0.125 mg around 1 or 2 PM if she feels like it is going to be a day where she does get really anxious.  That can hopefully help prevent it from happening. ?Congratulations on her daughter's wedding! ? ?Continue Xanax 0.125-0.25 mg daily as needed.  (She gets at [facility], due to cost) ?Continue Cymbalta 30 mg, 3 qd. ?Continue Lamictal 150 mg nightly. ?Continue Ambien  10 mg nightly as needed.  ?Return in 6 months. ? ?Melony Overly, PA-C  ? ? ? ?

## 2021-10-26 ENCOUNTER — Other Ambulatory Visit: Payer: Self-pay | Admitting: Physician Assistant

## 2021-11-09 ENCOUNTER — Other Ambulatory Visit: Payer: Self-pay | Admitting: Physician Assistant

## 2021-11-28 ENCOUNTER — Other Ambulatory Visit: Payer: Self-pay | Admitting: Physician Assistant

## 2021-12-29 ENCOUNTER — Ambulatory Visit (INDEPENDENT_AMBULATORY_CARE_PROVIDER_SITE_OTHER): Payer: 59 | Admitting: Physician Assistant

## 2021-12-29 ENCOUNTER — Encounter: Payer: Self-pay | Admitting: Physician Assistant

## 2021-12-29 DIAGNOSIS — G4700 Insomnia, unspecified: Secondary | ICD-10-CM

## 2021-12-29 DIAGNOSIS — F411 Generalized anxiety disorder: Secondary | ICD-10-CM

## 2021-12-29 DIAGNOSIS — F3341 Major depressive disorder, recurrent, in partial remission: Secondary | ICD-10-CM

## 2021-12-29 MED ORDER — DULOXETINE HCL 30 MG PO CPEP: 90.0000 mg | ORAL_CAPSULE | Freq: Every day | ORAL | 1 refills | Status: DC

## 2021-12-29 MED ORDER — ALPRAZOLAM 0.25 MG PO TABS: ORAL_TABLET | ORAL | 5 refills | Status: DC

## 2021-12-29 NOTE — Progress Notes (Signed)
[facility]  Patient ID: Valerie Kent,  MRN: [id number]  PCP: Georgianne Fick, MD  Date of Evaluation: 12/29/2021 Time spent:20 minutes  Chief Complaint:  Chief Complaint   Anxiety; Depression; Insomnia; Follow-up    HISTORY/CURRENT STATUS: HPI for routine med check.   She is doing well.  Still feels like her medications are effective. Is able to enjoy things.  Energy and motivation are good.  No extreme sadness, tearfulness, or feelings of hopelessness.  Sleeps well but requires Ambien or else she cannot fall asleep.  ADLs and personal hygiene are normal.   Denies any changes in concentration, making decisions, or remembering things.  Appetite has not changed.  Weight is stable.  Still has anxiety requiring Xanax.  She is not having panic attacks but more of a generalized sense of unease like something bad may happen.  Xanax is effective.  Denies suicidal or homicidal thoughts.  Patient denies increased energy with decreased need for sleep, increased talkativeness, racing thoughts, impulsivity or risky behaviors, increased spending, increased libido, grandiosity, increased irritability or anger, paranoia, or hallucinations.  Denies dizziness, syncope, seizures, numbness, tingling, tremor, tics, unsteady gait, slurred speech, confusion. Denies muscle or joint pain, stiffness, or dystonia.  Individual Medical History/ Review of Systems: Changes? :Yes    Dx with mixed connective tissue disease  Past medications for mental health diagnoses include: Paxil, Wellbutrin, Cymbalta at higher dose caused dizziness, Xanax, Depakote, Lamictal, Tegretol, Prozac, Zoloft, Lexapro, Luvox, Viibryd, Ambien  Allergies: Patient has no known allergies.  Current Medications:  Current Outpatient Medications:    acetaminophen (TYLENOL) 500 MG tablet, Take 1,000 mg by mouth every 6 (six) hours as needed for moderate pain or headache., Disp: , Rfl:    calcium citrate-vitamin D (CITRACAL+D)  315-200 MG-UNIT per tablet, Take 1 tablet by mouth 2 (two) times daily., Disp: , Rfl:    glucosamine-chondroitin 500-400 MG tablet, Take 1 tablet by mouth 3 (three) times daily., Disp: , Rfl:    hydroxychloroquine (PLAQUENIL) 200 MG tablet, 1 1/2 tabs Orally daily for 30 days, Disp: , Rfl:    ibuprofen (ADVIL,MOTRIN) 200 MG tablet, Take 600 mg by mouth every 6 (six) hours as needed for moderate pain., Disp: , Rfl:    lamoTRIgine (LAMICTAL) 150 MG tablet, TAKE 1 TABLET BY MOUTH AT BEDTIME., Disp: 90 tablet, Rfl: 1   magnesium 30 MG tablet, Take 30 mg by mouth 2 (two) times daily., Disp: , Rfl:    Melatonin 3-10 MG TABS, Take 5-10 mg by mouth at bedtime as needed., Disp: 60 tablet, Rfl: 0   meloxicam (MOBIC) 15 MG tablet, Take 15 mg by mouth daily., Disp: , Rfl:    MULTIPLE VITAMIN PO, Take 1 tablet by mouth daily., Disp: , Rfl:    Omega-3 Fatty Acids (FISH OIL) 1000 MG CAPS, Take 1 capsule by mouth daily., Disp: , Rfl:    TURMERIC PO, Take by mouth., Disp: , Rfl:    verapamil (CALAN-SR) 240 MG CR tablet, Take 480 mg by mouth at bedtime., Disp: , Rfl: 3   zolpidem (AMBIEN) 10 MG tablet, TAKE ONE TABLET BY MOUTH EVERY NIGHT AT BEDTIME AS NEEDED FOR SLEEP, Disp: 30 tablet, Rfl: 5   ALPRAZolam (XANAX) 0.25 MG tablet, TAKE 1 TABLET BY MOUTH AT BEDTIME AS NEEDED FOR ANXIETY., Disp: 30 tablet, Rfl: 5   DULoxetine (CYMBALTA) 30 MG capsule, Take 3 capsules (90 mg total) by mouth daily., Disp: 270 capsule, Rfl: 1 Medication Side Effects: none  Family Medical/ Social History: Changes?  No  MENTAL HEALTH EXAM:  Last menstrual period 02/24/2016.There is no height or weight on file to calculate BMI.  General Appearance: Casual, Neat and Well Groomed  Eye Contact:  Good  Speech:  Clear and Coherent and Normal Rate  Volume:  Normal  Mood:  Euthymic  Affect:  Congruent  Thought Process:  Goal Directed and Descriptions of Associations: Circumstantial  Orientation:  Full (Time, Place, and Person)  Thought  Content: Logical   Suicidal Thoughts:  No  Homicidal Thoughts:  No  Memory:  WNL  Judgement:  Good  Insight:  Good  Psychomotor Activity:  Normal  Concentration:  Concentration: Good and Attention Span: Good  Recall:  Good  Fund of Knowledge: Good  Language: Good  Assets:  Desire for Improvement  ADL's:  Intact  Cognition: WNL  Prognosis:  Good   DIAGNOSES:    ICD-10-CM   1. GAD (generalized anxiety disorder)  F41.1     2. Recurrent major depressive disorder, in partial remission ([place])  F33.41     3. Insomnia, unspecified type  G47.00       Receiving Psychotherapy: No   RECOMMENDATIONS:  PDMP was reviewed. Last Ambien filled 12/07/2021.  Xanax filled 11/29/2021. I provided 20 minutes of face to face time during this encounter, including time spent before and after the visit in records review, medical decision making, counseling pertinent to today's visit, and charting.   She is doing well with current medications so no changes are necessary.  Continue Xanax 0.125-0.25 mg daily as needed.  (She gets at [facility], due to cost) Continue Cymbalta 30 mg, 3 qd. Continue Lamictal 150 mg nightly. Continue Ambien 10 mg nightly as needed.  Return in 6 months.  Donnal Moat, PA-C

## 2022-01-01 ENCOUNTER — Encounter: Payer: Self-pay | Admitting: Physician Assistant

## 2022-02-22 ENCOUNTER — Other Ambulatory Visit: Payer: Self-pay | Admitting: Physician Assistant

## 2022-03-14 ENCOUNTER — Other Ambulatory Visit: Payer: Self-pay | Admitting: Physician Assistant

## 2022-05-11 ENCOUNTER — Other Ambulatory Visit: Payer: Self-pay | Admitting: Physician Assistant

## 2022-06-30 ENCOUNTER — Ambulatory Visit (INDEPENDENT_AMBULATORY_CARE_PROVIDER_SITE_OTHER): Payer: 59 | Admitting: Physician Assistant

## 2022-06-30 ENCOUNTER — Encounter: Payer: Self-pay | Admitting: Physician Assistant

## 2022-06-30 DIAGNOSIS — F3341 Major depressive disorder, recurrent, in partial remission: Secondary | ICD-10-CM | POA: Diagnosis not present

## 2022-06-30 DIAGNOSIS — G4700 Insomnia, unspecified: Secondary | ICD-10-CM | POA: Diagnosis not present

## 2022-06-30 DIAGNOSIS — F411 Generalized anxiety disorder: Secondary | ICD-10-CM | POA: Diagnosis not present

## 2022-06-30 MED ORDER — DULOXETINE HCL 30 MG PO CPEP: 90.0000 mg | ORAL_CAPSULE | Freq: Every day | ORAL | 1 refills | Status: DC

## 2022-06-30 MED ORDER — ALPRAZOLAM 0.25 MG PO TABS: ORAL_TABLET | ORAL | 5 refills | Status: DC

## 2022-06-30 NOTE — Progress Notes (Signed)
[facility]  Patient ID: Valerie Kent,  MRN: [id number]  PCP: Georgianne Fick, MD  Date of Evaluation: 06/30/2022 Time spent:20 minutes  Chief Complaint:  Chief Complaint   Anxiety; Depression; Insomnia; Follow-up    HISTORY/CURRENT STATUS: HPI for routine med check.   Doing well overall. Patient is able to enjoy things.  Energy and motivation are good.  No extreme sadness, tearfulness, or feelings of hopelessness. ADLs and personal hygiene are normal.   Denies any changes in concentration, making decisions, or remembering things.  Appetite has not changed.  Weight is stable.  Denies suicidal or homicidal thoughts.  Still needs the Ambien every night or can't go to sleep. With it, she gets about 7 hours, and feels rested when she wakes up. Anxiety is still a prob occas. Takes Xanax prn. Sometimes she can get panicky for no known reason, and xanax helps. Has a dog that she takes everywhere she can, helps w/ anxiety too. She's going to [facility] soon to have a week's worth of intensive training for it.  She asks how to go about having it registered as a service or emotional support animal.  Patient denies increased energy with decreased need for sleep, increased talkativeness, racing thoughts, impulsivity or risky behaviors, increased spending, increased libido, grandiosity, increased irritability or anger, paranoia, or hallucinations.  Denies dizziness, syncope, seizures, numbness, tingling, tremor, tics, unsteady gait, slurred speech, confusion. Denies muscle or joint pain, stiffness, or dystonia.  Individual Medical History/ Review of Systems: Changes? :No      Past medications for mental health diagnoses include: Paxil, Wellbutrin, Cymbalta at higher dose caused dizziness, Xanax, Depakote, Lamictal, Tegretol, Prozac, Zoloft, Lexapro, Luvox, Viibryd, Ambien  Allergies: Patient has no known allergies.  Current Medications:  Current Outpatient Medications:     acetaminophen (TYLENOL) 500 MG tablet, Take 1,000 mg by mouth every 6 (six) hours as needed for moderate pain or headache., Disp: , Rfl:    calcium citrate-vitamin D (CITRACAL+D) 315-200 MG-UNIT per tablet, Take 1 tablet by mouth 2 (two) times daily., Disp: , Rfl:    glucosamine-chondroitin 500-400 MG tablet, Take 1 tablet by mouth 3 (three) times daily., Disp: , Rfl:    hydroxychloroquine (PLAQUENIL) 200 MG tablet, 1 1/2 tabs Orally daily for 30 days, Disp: , Rfl:    lamoTRIgine (LAMICTAL) 150 MG tablet, TAKE 1 TABLET BY MOUTH EVERYDAY AT BEDTIME, Disp: 90 tablet, Rfl: 1   magnesium 30 MG tablet, Take 30 mg by mouth 2 (two) times daily., Disp: , Rfl:    Melatonin 3-10 MG TABS, Take 5-10 mg by mouth at bedtime as needed., Disp: 60 tablet, Rfl: 0   meloxicam (MOBIC) 15 MG tablet, Take 15 mg by mouth daily., Disp: , Rfl:    MULTIPLE VITAMIN PO, Take 1 tablet by mouth daily., Disp: , Rfl:    Omega-3 Fatty Acids (FISH OIL) 1000 MG CAPS, Take 1 capsule by mouth daily., Disp: , Rfl:    TURMERIC PO, Take by mouth., Disp: , Rfl:    verapamil (CALAN-SR) 240 MG CR tablet, Take 480 mg by mouth at bedtime., Disp: , Rfl: 3   zolpidem (AMBIEN) 10 MG tablet, TAKE ONE TABLET BY MOUTH EVERY NIGHT AT BEDTIME AS NEEDED FOR SLEEP, Disp: 30 tablet, Rfl: 5   ALPRAZolam (XANAX) 0.25 MG tablet, TAKE 1 TABLET BY MOUTH AT BEDTIME AS NEEDED FOR ANXIETY., Disp: 30 tablet, Rfl: 5   DULoxetine (CYMBALTA) 30 MG capsule, Take 3 capsules (90 mg total) by mouth daily., Disp: 270 capsule,  Rfl: 1   ibuprofen (ADVIL,MOTRIN) 200 MG tablet, Take 600 mg by mouth every 6 (six) hours as needed for moderate pain. (Patient not taking: Reported on 06/30/2022), Disp: , Rfl:  Medication Side Effects: none  Family Medical/ Social History: Changes? Valerie Kent niece died last fall. Had a heart transplant years ago.   MENTAL HEALTH EXAM:  Last menstrual period 02/24/2016.There is no height or weight on file to calculate BMI.  General Appearance:  Casual, Neat and Well Groomed  Eye Contact:  Good  Speech:  Clear and Coherent and Normal Rate  Volume:  Normal  Mood:  Euthymic  Affect:  Congruent  Thought Process:  Goal Directed and Descriptions of Associations: Circumstantial  Orientation:  Full (Time, Place, and Person)  Thought Content: Logical   Suicidal Thoughts:  No  Homicidal Thoughts:  No  Memory:  WNL  Judgement:  Good  Insight:  Good  Psychomotor Activity:  Normal  Concentration:  Concentration: Good and Attention Span: Good  Recall:  Good  Fund of Knowledge: Good  Language: Good  Assets:  Desire for Improvement Financial Resources/Insurance Housing Transportation  ADL's:  Intact  Cognition: WNL  Prognosis:  Good   DIAGNOSES:    ICD-10-CM   1. GAD (generalized anxiety disorder)  F41.1     2. Recurrent major depressive disorder, in partial remission (HCC)  F33.41     3. Insomnia, unspecified type  G47.00       Receiving Psychotherapy: No   RECOMMENDATIONS:  PDMP was reviewed. Last Ambien filled for 09/23/2022. Xanax filled 01/04/2022. I provided 20 minutes of face to face time during this encounter, including time spent before and after the visit in records review, medical decision making, counseling pertinent to today's visit, and charting.   My condolences in the loss of Valerie Kent niece. Recommend she go on the [employer] disabilities act website, Register Valerie Kent dog as an emotional support animal and if she needs me to write a letter stating that Valerie Kent dog needs to be with Valerie Kent at all times she should let me know.  Continue Xanax 0.125-0.25 mg daily as needed.   Continue Cymbalta 30 mg, 3 qd. Continue Lamictal 150 mg nightly. Continue Ambien 10 mg nightly as needed.  Return in 6 months.  Melony Overly, PA-C

## 2022-10-04 ENCOUNTER — Other Ambulatory Visit: Payer: Self-pay | Admitting: Physician Assistant

## 2022-10-31 ENCOUNTER — Telehealth: Payer: Self-pay | Admitting: Physician Assistant

## 2022-10-31 NOTE — Telephone Encounter (Signed)
LF 8/7

## 2022-10-31 NOTE — Telephone Encounter (Signed)
Pt called at 11:45 and LM. She attempted to get her Ambien filled today because she leaves Friday to go to [place]. Was told she cannot RF it early. Can we call for an early RF approval? [id number] - [place], [place] - [phone]

## 2022-10-31 NOTE — Telephone Encounter (Signed)
Yes, please pend, with 5 Refills. And add note to pharmacy to fill early d/t being out of country when Rx is due. Thanks

## 2022-10-31 NOTE — Telephone Encounter (Signed)
Patient leaving for [place] 8/30, will be gone for 2 weeks. Last filled Ambien 8/7. Does not have a RF available, but asking for an early fill. Will pend if approved.   HT -.

## 2022-11-01 ENCOUNTER — Other Ambulatory Visit: Payer: Self-pay

## 2022-11-01 MED ORDER — ZOLPIDEM TARTRATE 10 MG PO TABS: 10.0000 mg | ORAL_TABLET | Freq: Every evening | ORAL | 5 refills | Status: DC | PRN

## 2022-11-01 NOTE — Telephone Encounter (Signed)
Pended with note to fill today.

## 2022-12-25 ENCOUNTER — Ambulatory Visit: Payer: 59 | Admitting: Physician Assistant

## 2022-12-25 ENCOUNTER — Encounter: Payer: Self-pay | Admitting: Physician Assistant

## 2022-12-25 DIAGNOSIS — G4700 Insomnia, unspecified: Secondary | ICD-10-CM | POA: Diagnosis not present

## 2022-12-25 DIAGNOSIS — F3341 Major depressive disorder, recurrent, in partial remission: Secondary | ICD-10-CM | POA: Diagnosis not present

## 2022-12-25 DIAGNOSIS — F411 Generalized anxiety disorder: Secondary | ICD-10-CM

## 2022-12-25 MED ORDER — DULOXETINE HCL 30 MG PO CPEP: 90.0000 mg | ORAL_CAPSULE | Freq: Every day | ORAL | 1 refills | Status: DC

## 2022-12-25 MED ORDER — ALPRAZOLAM 0.25 MG PO TABS: ORAL_TABLET | ORAL | 5 refills | Status: DC

## 2022-12-25 NOTE — Progress Notes (Signed)
[facility]  Patient ID: Valerie Kent,  MRN: [id number]  PCP: Georgianne Fick, MD  Date of Evaluation: 12/25/2022 Time spent: 21 minutes  Chief Complaint:  Chief Complaint   Anxiety; Depression; Insomnia; Follow-up    HISTORY/CURRENT STATUS: HPI for routine med check.   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 Patient is able to enjoy things.  Energy and motivation are good.  No extreme sadness, tearfulness, or feelings of hopelessness.  Sleeps well with the ambien. ADLs and personal hygiene are normal.   Denies any changes in concentration, making decisions, or remembering things.  Appetite has not changed.  Weight is stable.  Denies suicidal or homicidal thoughts.  Anxiety is controlled with the Xanax. Doesn't take it everyday. Is concerned with the situation with her husband's job but also [employer] of the country and the upcoming election.  Patient denies increased energy with decreased need for sleep, increased talkativeness, racing thoughts, impulsivity or risky behaviors, increased spending, increased libido, grandiosity, increased irritability or anger, paranoia, or hallucinations.  Denies dizziness, syncope, seizures, numbness, tingling, tremor, tics, unsteady gait, slurred speech, confusion. Denies muscle or joint pain, stiffness, or dystonia.  Individual Medical History/ Review of Systems: Changes? :No      Past medications for mental health diagnoses include: Paxil, Wellbutrin, Cymbalta at higher dose caused dizziness, Xanax, Depakote, Lamictal, Tegretol, Prozac, Zoloft, Lexapro, Luvox, Viibryd, Ambien  Allergies: Patient has no known allergies.  Current Medications:  Current Outpatient Medications:    acetaminophen (TYLENOL) 500 MG tablet, Take 1,000 mg by mouth every 6 (six) hours as needed for moderate pain or headache., Disp: , Rfl:    calcium citrate-vitamin D (CITRACAL+D) 315-200 MG-UNIT per  tablet, Take 1 tablet by mouth 2 (two) times daily., Disp: , Rfl:    glucosamine-chondroitin 500-400 MG tablet, Take 1 tablet by mouth 3 (three) times daily., Disp: , Rfl:    hydroxychloroquine (PLAQUENIL) 200 MG tablet, 1 1/2 tabs Orally daily for 30 days, Disp: , Rfl:    lamoTRIgine (LAMICTAL) 150 MG tablet, TAKE 1 TABLET BY MOUTH EVERYDAY AT BEDTIME, Disp: 90 tablet, Rfl: 0   magnesium 30 MG tablet, Take 30 mg by mouth 2 (two) times daily., Disp: , Rfl:    Melatonin 3-10 MG TABS, Take 5-10 mg by mouth at bedtime as needed., Disp: 60 tablet, Rfl: 0   meloxicam (MOBIC) 15 MG tablet, Take 15 mg by mouth daily., Disp: , Rfl:    MULTIPLE VITAMIN PO, Take 1 tablet by mouth daily., Disp: , Rfl:    Omega-3 Fatty Acids (FISH OIL) 1000 MG CAPS, Take 1 capsule by mouth daily., Disp: , Rfl:    TURMERIC PO, Take by mouth., Disp: , Rfl:    verapamil (CALAN-SR) 240 MG CR tablet, Take 480 mg by mouth at bedtime., Disp: , Rfl: 3   zolpidem (AMBIEN) 10 MG tablet, Take 1 tablet (10 mg total) by mouth at bedtime as needed for sleep., Disp: 30 tablet, Rfl: 5   ALPRAZolam (XANAX) 0.25 MG tablet, TAKE 1 TABLET BY MOUTH AT BEDTIME AS NEEDED FOR ANXIETY., Disp: 30 tablet, Rfl: 5   DULoxetine (CYMBALTA) 30 MG capsule, Take 3 capsules (90 mg total) by mouth daily., Disp: 270 capsule, Rfl: 1   ibuprofen (ADVIL,MOTRIN) 200 MG tablet, Take 600 mg by mouth every 6 (six) hours as needed for moderate pain. (Patient not taking: Reported on 06/30/2022), Disp: , Rfl:  Medication Side Effects: none  Family  Medical/ Social History: Changes? Her niece died last fall. Had a heart transplant years ago.   MENTAL HEALTH EXAM:  Last menstrual period 02/24/2016.There is no height or weight on file to calculate BMI.  General Appearance: Casual, Neat and Well Groomed  Eye Contact:  Good  Speech:  Clear and Coherent and Normal Rate  Volume:  Normal  Mood:  Euthymic  Affect:  Congruent  Thought Process:  Goal Directed and Descriptions  of Associations: Circumstantial  Orientation:  Full (Time, Place, and Person)  Thought Content: Logical   Suicidal Thoughts:  No  Homicidal Thoughts:  No  Memory:  WNL  Judgement:  Good  Insight:  Good  Psychomotor Activity:  Normal  Concentration:  Concentration: Good and Attention Span: Good  Recall:  Good  Fund of Knowledge: Good  Language: Good  Assets:  Desire for Improvement Financial Resources/Insurance Housing Resilience Transportation  ADL's:  Intact  Cognition: WNL  Prognosis:  Good   DIAGNOSES:    ICD-10-CM   1. GAD (generalized anxiety disorder)  F41.1     2. Recurrent major depressive disorder, in partial remission (HCC)  F33.41     3. Insomnia, unspecified type  G47.00       Receiving Psychotherapy: No   RECOMMENDATIONS:  PDMP was reviewed. Last Ambien filled for 12/08/2022.  Xanax filled 10/04/2022. I provided 21 minutes of face to face time during this encounter, including time spent before and after the visit in records review, medical decision making, counseling pertinent to today's visit, and charting.   She is doing well with current medications so no changes will be made.  Continue Xanax 0.125-0.25 mg daily as needed.   Continue Cymbalta 30 mg, 3 qd. Continue Lamictal 150 mg nightly. Continue Ambien 10 mg nightly as needed.  Return in 6 months.  Melony Overly, PA-C

## 2023-01-03 ENCOUNTER — Other Ambulatory Visit: Payer: Self-pay | Admitting: Physician Assistant

## 2023-01-03 NOTE — Telephone Encounter (Signed)
LF 08/1; I changed rf amount to 1, to get pt to next appt.

## 2023-05-09 ENCOUNTER — Other Ambulatory Visit: Payer: Self-pay | Admitting: Physician Assistant

## 2023-05-09 NOTE — Telephone Encounter (Signed)
 LF 2/5, due 3/6

## 2023-06-26 ENCOUNTER — Encounter: Payer: Self-pay | Admitting: Physician Assistant

## 2023-06-26 ENCOUNTER — Ambulatory Visit (INDEPENDENT_AMBULATORY_CARE_PROVIDER_SITE_OTHER): Payer: 59 | Admitting: Physician Assistant

## 2023-06-26 DIAGNOSIS — F411 Generalized anxiety disorder: Secondary | ICD-10-CM | POA: Diagnosis not present

## 2023-06-26 DIAGNOSIS — F3341 Major depressive disorder, recurrent, in partial remission: Secondary | ICD-10-CM

## 2023-06-26 DIAGNOSIS — G4700 Insomnia, unspecified: Secondary | ICD-10-CM

## 2023-06-26 MED ORDER — LAMOTRIGINE 150 MG PO TABS: 150.0000 mg | ORAL_TABLET | Freq: Every day | ORAL | 1 refills | Status: DC

## 2023-06-26 MED ORDER — ZOLPIDEM TARTRATE 10 MG PO TABS: 10.0000 mg | ORAL_TABLET | Freq: Every evening | ORAL | 5 refills | Status: DC | PRN

## 2023-06-26 MED ORDER — DULOXETINE HCL 30 MG PO CPEP: 90.0000 mg | ORAL_CAPSULE | Freq: Every day | ORAL | 1 refills | Status: DC

## 2023-06-26 MED ORDER — ALPRAZOLAM 0.25 MG PO TABS: ORAL_TABLET | ORAL | 5 refills | Status: DC

## 2023-06-26 NOTE — Progress Notes (Signed)
 [facility]  Patient ID: Valerie Kent,  MRN: [id number]  PCP: Virgle Grime, MD  Date of Evaluation: 06/26/2023 Time spent:20 minutes  Chief Complaint:  Chief Complaint   Anxiety; Depression; Follow-up; Insomnia    HISTORY/CURRENT STATUS: HPI for routine med check.   Anxiety is pretty bad. Meds are working though, she just doesn't want to go out, be around people, gets nervous. "I'm not depressed. I just get nervous being around people or in new situations."  She has thought about seeing a counselor to help her with this.  Patient is able to enjoy things.  Energy and motivation are good.  No extreme sadness, tearfulness, or feelings of hopelessness.  Sleeps well but has to take the Ambien  or else she will not be able to fall asleep or stay asleep.  She also takes magnesium for migraine prevention and it is effective.  ADLs and personal hygiene are normal.   Denies any changes in concentration, making decisions, or remembering things.  Appetite has not changed.  Weight is stable.   Denies suicidal or homicidal thoughts.  Patient denies increased energy with decreased need for sleep, increased talkativeness, racing thoughts, impulsivity or risky behaviors, increased spending, increased libido, grandiosity, increased irritability or anger, paranoia, or hallucinations.  Denies dizziness, syncope, seizures, numbness, tingling, tremor, tics, unsteady gait, slurred speech, confusion. Denies muscle or joint pain, stiffness, or dystonia.  Individual Medical History/ Review of Systems: Changes? :No      Past medications for mental health diagnoses include: Paxil, Wellbutrin, Cymbalta  at higher dose caused dizziness, Xanax , Depakote, Lamictal , Tegretol, Prozac, Zoloft, Lexapro, Luvox, Viibryd, Ambien   Allergies: Patient has no known allergies.  Current Medications:  Current Outpatient Medications:    acetaminophen  (TYLENOL ) 500 MG tablet, Take 1,000 mg by mouth every 6  (six) hours as needed for moderate pain or headache., Disp: , Rfl:    calcium citrate-vitamin D (CITRACAL+D) 315-200 MG-UNIT per tablet, Take 1 tablet by mouth 2 (two) times daily., Disp: , Rfl:    glucosamine-chondroitin 500-400 MG tablet, Take 1 tablet by mouth 3 (three) times daily., Disp: , Rfl:    hydroxychloroquine (PLAQUENIL) 200 MG tablet, 1 1/2 tabs Orally daily for 30 days, Disp: , Rfl:    magnesium 30 MG tablet, Take 30 mg by mouth 2 (two) times daily., Disp: , Rfl:    Melatonin 3-10 MG TABS, Take 5-10 mg by mouth at bedtime as needed., Disp: 60 tablet, Rfl: 0   meloxicam (MOBIC) 15 MG tablet, Take 15 mg by mouth daily., Disp: , Rfl:    MULTIPLE VITAMIN PO, Take 1 tablet by mouth daily., Disp: , Rfl:    TURMERIC PO, Take by mouth., Disp: , Rfl:    verapamil (CALAN-SR) 240 MG CR tablet, Take 480 mg by mouth at bedtime., Disp: , Rfl: 3   ALPRAZolam  (XANAX ) 0.25 MG tablet, TAKE 1 TABLET BY MOUTH AT BEDTIME AS NEEDED FOR ANXIETY., Disp: 30 tablet, Rfl: 5   DULoxetine  (CYMBALTA ) 30 MG capsule, Take 3 capsules (90 mg total) by mouth daily., Disp: 270 capsule, Rfl: 1   ibuprofen (ADVIL,MOTRIN) 200 MG tablet, Take 600 mg by mouth every 6 (six) hours as needed for moderate pain. (Patient not taking: Reported on 06/30/2022), Disp: , Rfl:    lamoTRIgine  (LAMICTAL ) 150 MG tablet, Take 1 tablet (150 mg total) by mouth daily., Disp: 90 tablet, Rfl: 1   Omega-3 Fatty Acids (FISH OIL) 1000 MG CAPS, Take 1 capsule by mouth daily. (Patient not taking: Reported on 06/26/2023),  Disp: , Rfl:    zolpidem  (AMBIEN ) 10 MG tablet, Take 1 tablet (10 mg total) by mouth at bedtime as needed. for sleep, Disp: 30 tablet, Rfl: 5 Medication Side Effects: none  Family Medical/ Social History: Changes? no  MENTAL HEALTH EXAM:  Last menstrual period 02/24/2016.There is no height or weight on file to calculate BMI.  General Appearance: Casual, Neat and Well Groomed  Eye Contact:  Good  Speech:  Clear and Coherent and  Normal Rate  Volume:  Normal  Mood:  Euthymic  Affect:  Congruent  Thought Process:  Goal Directed and Descriptions of Associations: Circumstantial  Orientation:  Full (Time, Place, and Person)  Thought Content: Logical   Suicidal Thoughts:  No  Homicidal Thoughts:  No  Memory:  WNL  Judgement:  Good  Insight:  Good  Psychomotor Activity:  Normal  Concentration:  Concentration: Good and Attention Span: Good  Recall:  Good  Fund of Knowledge: Good  Language: Good  Assets:  Desire for Improvement Financial Resources/Insurance Housing Leisure Time Resilience Transportation  ADL's:  Intact  Cognition: WNL  Prognosis:  Good   DIAGNOSES:    ICD-10-CM   1. GAD (generalized anxiety disorder)  F41.1     2. Recurrent major depressive disorder, in partial remission (HCC)  F33.41     3. Insomnia, unspecified type  G47.00       Receiving Psychotherapy: No   RECOMMENDATIONS:  PDMP was reviewed. Last Ambien  06/12/2023.  Xanax  filled 12/25/2022. I provided 20 minutes of face to face time during this encounter, including time spent before and after the visit in records review, medical decision making, counseling pertinent to today's visit, and charting.   We discussed the anxiety.  The Xanax  is effective and Cymbalta  does help prevent it.  I recommend Dr. Elder Greening, who is bilingual, since English is patient's second language it may be easier for her in counseling to see someone who is a [place] speaker.  Continue Xanax  0.125-0.25 mg daily as needed.   Continue Cymbalta  30 mg, 3 qd. Continue Lamictal  150 mg nightly. Continue Ambien  10 mg nightly as needed.  Return in 6 months.  Marvia Slocumb, PA-C

## 2023-12-20 ENCOUNTER — Other Ambulatory Visit: Payer: Self-pay | Admitting: Physician Assistant

## 2023-12-26 ENCOUNTER — Ambulatory Visit (INDEPENDENT_AMBULATORY_CARE_PROVIDER_SITE_OTHER): Admitting: Physician Assistant

## 2023-12-26 ENCOUNTER — Encounter: Payer: Self-pay | Admitting: Physician Assistant

## 2023-12-26 DIAGNOSIS — F411 Generalized anxiety disorder: Secondary | ICD-10-CM | POA: Diagnosis not present

## 2023-12-26 DIAGNOSIS — F3341 Major depressive disorder, recurrent, in partial remission: Secondary | ICD-10-CM

## 2023-12-26 DIAGNOSIS — G4700 Insomnia, unspecified: Secondary | ICD-10-CM | POA: Diagnosis not present

## 2023-12-26 MED ORDER — DULOXETINE HCL 60 MG PO CPEP: 60.0000 mg | ORAL_CAPSULE | Freq: Two times a day (BID) | ORAL | 1 refills | Status: AC

## 2023-12-26 MED ORDER — ALPRAZOLAM 0.25 MG PO TABS: ORAL_TABLET | ORAL | 5 refills | Status: AC

## 2023-12-26 MED ORDER — ZOLPIDEM TARTRATE 10 MG PO TABS: 10.0000 mg | ORAL_TABLET | Freq: Every evening | ORAL | 5 refills | Status: AC | PRN

## 2023-12-26 NOTE — Progress Notes (Signed)
 [facility]  Patient ID: Valerie Kent,  MRN: [id number]  PCP: Verdia Lombard, MD  Date of Evaluation: 12/26/2023 Time spent:25 minutes  Chief Complaint:  Chief Complaint   Anxiety; Depression; Insomnia; Follow-up    HISTORY/CURRENT STATUS: HPI for routine med check.   Doing well with her meds.  Neuro suggested cymbalta  increase.  Is now on 60 mg bid. She has chronic pain and thinks that may be helping some. Patient is able to enjoy things.  Energy and motivation are good.  No extreme sadness, tearfulness, or feelings of hopelessness.  Sleeps well with the Ambien .  Also magnesium helps.  ADLs and personal hygiene are normal.   Denies any changes in concentration, making decisions, or remembering things.  Appetite has not changed.  Weight is stable.  Anxiety is still a problem at times.  She takes the Xanax  when needed.  No panic attacks but she just gets overwhelmed sometimes.  No mania, delirium, AH/VH.  No SI/HI.  Individual Medical History/ Review of Systems: Changes? :No      Past medications for mental health diagnoses include: Paxil, Wellbutrin, Cymbalta  at higher dose caused dizziness, Xanax , Depakote, Lamictal , Tegretol, Prozac, Zoloft, Lexapro, Luvox, Viibryd, Ambien   Allergies: Patient has no known allergies.  Current Medications:  Current Outpatient Medications:    acetaminophen  (TYLENOL ) 500 MG tablet, Take 1,000 mg by mouth every 6 (six) hours as needed for moderate pain or headache., Disp: , Rfl:    calcium citrate-vitamin D (CITRACAL+D) 315-200 MG-UNIT per tablet, Take 1 tablet by mouth 2 (two) times daily., Disp: , Rfl:    DULoxetine  (CYMBALTA ) 60 MG capsule, Take 1 capsule (60 mg total) by mouth 2 (two) times daily., Disp: 180 capsule, Rfl: 1   glucosamine-chondroitin 500-400 MG tablet, Take 1 tablet by mouth 3 (three) times daily., Disp: , Rfl:    hydroxychloroquine (PLAQUENIL) 200 MG tablet, 1 1/2 tabs Orally daily for 30 days, Disp: , Rfl:     lamoTRIgine  (LAMICTAL ) 150 MG tablet, TAKE 1 TABLET BY MOUTH EVERY DAY, Disp: 90 tablet, Rfl: 1   magnesium 30 MG tablet, Take 30 mg by mouth 2 (two) times daily., Disp: , Rfl:    Melatonin 3-10 MG TABS, Take 5-10 mg by mouth at bedtime as needed., Disp: 60 tablet, Rfl: 0   MULTIPLE VITAMIN PO, Take 1 tablet by mouth daily., Disp: , Rfl:    Omega-3 Fatty Acids (FISH OIL) 1000 MG CAPS, Take 1 capsule by mouth daily., Disp: , Rfl:    TURMERIC PO, Take by mouth., Disp: , Rfl:    verapamil (CALAN-SR) 240 MG CR tablet, Take 480 mg by mouth at bedtime., Disp: , Rfl: 3   ALPRAZolam  (XANAX ) 0.25 MG tablet, TAKE 1 TABLET BY MOUTH AT BEDTIME AS NEEDED FOR ANXIETY., Disp: 30 tablet, Rfl: 5   ibuprofen (ADVIL,MOTRIN) 200 MG tablet, Take 600 mg by mouth every 6 (six) hours as needed for moderate pain. (Patient not taking: Reported on 06/30/2022), Disp: , Rfl:    meloxicam (MOBIC) 15 MG tablet, Take 15 mg by mouth daily. (Patient not taking: Reported on 12/26/2023), Disp: , Rfl:    zolpidem  (AMBIEN ) 10 MG tablet, Take 1 tablet (10 mg total) by mouth at bedtime as needed. for sleep, Disp: 30 tablet, Rfl: 5 Medication Side Effects: none  Family Medical/ Social History: Changes? no  MENTAL HEALTH EXAM:  Last menstrual period 02/24/2016.There is no height or weight on file to calculate BMI.  General Appearance: Casual and Well Groomed  Eye  Contact:  Good  Speech:  Clear and Coherent and Normal Rate  Volume:  Normal  Mood:  Euthymic  Affect:  Congruent  Thought Process:  Goal Directed and Descriptions of Associations: Circumstantial  Orientation:  Full (Time, Place, and Person)  Thought Content: Logical   Suicidal Thoughts:  No  Homicidal Thoughts:  No  Memory:  WNL  Judgement:  Good  Insight:  Good  Psychomotor Activity:  Normal  Concentration:  Concentration: Good and Attention Span: Good  Recall:  Good  Fund of Knowledge: Good  Language: Good  Assets:  Desire for Improvement Financial  Resources/Insurance Housing Leisure Time Resilience Transportation  ADL's:  Intact  Cognition: WNL  Prognosis:  Good   DIAGNOSES:    ICD-10-CM   1. Recurrent major depressive disorder, in partial remission  F33.41     2. GAD (generalized anxiety disorder)  F41.1     3. Insomnia, unspecified type  G47.00       Receiving Psychotherapy: No   RECOMMENDATIONS:  PDMP was reviewed. Last Ambien  12/11/2023. Xanax  filled 10/30/2023. I provided approximately  25 minutes of face to face time during this encounter, including time spent before and after the visit in records review, medical decision making, counseling pertinent to today's visit, and charting.   He is doing well from a mental health medication standpoint.  I will send in the Cymbalta  with the higher dose that she is already on it per neuro.  Continue Xanax  0.125-0.25 mg daily as needed.   Continue Cymbalta  3 mg, 1 p.o. twice daily. Continue Lamictal  150 mg nightly. Continue Ambien  10 mg nightly as needed.  Return in 6 months.  Verneita Cooks, PA-C

## 2024-06-25 ENCOUNTER — Ambulatory Visit: Admitting: Physician Assistant
